# Patient Record
Sex: Female | Born: 1975
Health system: Southern US, Community
[De-identification: ages and names within clinical notes are randomized; demographics above are authoritative.]

## PROBLEM LIST (undated history)

## (undated) DIAGNOSIS — Z8041 Family history of malignant neoplasm of ovary: Secondary | ICD-10-CM

## (undated) DIAGNOSIS — F329 Major depressive disorder, single episode, unspecified: Secondary | ICD-10-CM

## (undated) DIAGNOSIS — B019 Varicella without complication: Secondary | ICD-10-CM

## (undated) DIAGNOSIS — Z800 Family history of malignant neoplasm of digestive organs: Secondary | ICD-10-CM

## (undated) DIAGNOSIS — F419 Anxiety disorder, unspecified: Secondary | ICD-10-CM

## (undated) DIAGNOSIS — Z808 Family history of malignant neoplasm of other organs or systems: Secondary | ICD-10-CM

## (undated) DIAGNOSIS — E785 Hyperlipidemia, unspecified: Secondary | ICD-10-CM

## (undated) DIAGNOSIS — F32A Depression, unspecified: Secondary | ICD-10-CM

## (undated) DIAGNOSIS — Z8042 Family history of malignant neoplasm of prostate: Secondary | ICD-10-CM

## (undated) DIAGNOSIS — N390 Urinary tract infection, site not specified: Secondary | ICD-10-CM

## (undated) HISTORY — DX: Major depressive disorder, single episode, unspecified: F32.9

## (undated) HISTORY — DX: Family history of malignant neoplasm of other organs or systems: Z80.8

## (undated) HISTORY — DX: Family history of malignant neoplasm of ovary: Z80.41

## (undated) HISTORY — DX: Family history of malignant neoplasm of prostate: Z80.42

## (undated) HISTORY — DX: Hyperlipidemia, unspecified: E78.5

## (undated) HISTORY — DX: Depression, unspecified: F32.A

## (undated) HISTORY — DX: Anxiety disorder, unspecified: F41.9

## (undated) HISTORY — DX: Varicella without complication: B01.9

## (undated) HISTORY — DX: Family history of malignant neoplasm of digestive organs: Z80.0

## (undated) HISTORY — DX: Urinary tract infection, site not specified: N39.0

---
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
## 1998-11-03 HISTORY — PX: ANKLE SURGERY: SHX546

## 2000-11-03 HISTORY — PX: BREAST BIOPSY: SHX20

## 2013-07-25 ENCOUNTER — Encounter: Payer: Self-pay | Admitting: Nurse Practitioner

## 2013-07-25 ENCOUNTER — Ambulatory Visit (INDEPENDENT_AMBULATORY_CARE_PROVIDER_SITE_OTHER): Payer: BC Managed Care – PPO | Admitting: Nurse Practitioner

## 2013-07-25 VITALS — BP 110/74 | HR 60 | Resp 16 | Ht 65.0 in | Wt 171.0 lb

## 2013-07-25 DIAGNOSIS — Z01419 Encounter for gynecological examination (general) (routine) without abnormal findings: Secondary | ICD-10-CM

## 2013-07-25 NOTE — Progress Notes (Signed)
Patient ID: Pamela Villegas, female   DOB: 1976-01-24, 37 y.o.   MRN: [id number] 37 y.o. G0P0 Single Caucasian Fe here for annual exam.  Menses now at 3-4 days, some cramps. Some bloating.  No PMS.  Same partner for 2.5 years. 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  Really likes her job.  Patient's last menstrual period was 07/02/2013.          Sexually active: yes  The current method of family planning is female/female relationship.    Exercising: yes  Home exercise routine includes running and lifting weights 2-3 times week. Smoker:  no  Health Maintenance: Pap:  06/22/12, WNL, neg HR HPV TDaP:  05/2012 Gardasil completed 2006 Labs: PCP   reports that she has never smoked. She has never used smokeless tobacco. She reports that she drinks about 3.0 ounces of alcohol per week. She reports that she does not use illicit drugs.  Past Medical History  Diagnosis Date  . Depression   . Anxiety     Past Surgical History  Procedure Laterality Date  . Ankle surgery Right 2000    Current Outpatient Prescriptions  Medication Sig Dispense Refill  . B Complex Vitamins (B COMPLEX 1 PO) Take by mouth daily.      . busPIRone (BUSPAR) 5 MG tablet Take 5 mg by mouth 2 (two) times daily.      . Flaxseed, Linseed, (FLAXSEED OIL PO) Take by mouth daily.      . Multiple Vitamin (MULTI-VITAMIN DAILY PO) Take by mouth daily.       No current facility-administered medications for this visit.    Family History  Problem Relation Age of Onset  . Cancer Mother 61    ovarian cancer  . Cancer Father 58    Lung cancer  . Osteoporosis Maternal Grandmother   . Cancer Maternal Grandfather 14    Colon Cancer  . Parkinsonism Paternal Grandmother     ROS:  Pertinent items are noted in HPI.  Otherwise, a comprehensive ROS was negative.  Exam:   BP 110/74  Pulse 60  Resp 16  Ht 5\' 5"  (1.651 m)  Wt 171 lb (77.565 kg)  BMI 28.46 kg/m2  LMP 07/02/2013 Height: 5\' 5"  (165.1 cm)  Ht  Readings from Last 3 Encounters:  07/25/13 5\' 5"  (1.651 m)    General appearance: alert, cooperative and appears stated age Head: Normocephalic, without obvious abnormality, atraumatic Neck: no adenopathy, supple, symmetrical, trachea midline and thyroid normal to inspection and palpation Lungs: clear to auscultation bilaterally Breasts: normal appearance, no masses or tenderness Heart: regular rate and rhythm Abdomen: soft, non-tender; no masses,  no organomegaly Extremities: extremities normal, atraumatic, no cyanosis or edema Skin: Skin color, texture, turgor normal. No rashes or lesions Lymph nodes: Cervical, supraclavicular, and axillary nodes normal. No abnormal inguinal nodes palpated Neurologic: Grossly normal   Pelvic: External genitalia:  no lesions              Urethra:  normal appearing urethra with no masses, tenderness or lesions              Bartholin's and Skene's: normal                 Vagina: normal appearing vagina with normal color and discharge, no lesions              Cervix: anteverted              Pap  taken: no Bimanual Exam:  Uterus:  normal size, contour, position, consistency, mobility, non-tender              Adnexa: no mass, fullness, tenderness               Rectovaginal: Confirms               Anus:  normal sphincter tone, no lesions  A:  Well Woman with normal exam  Regular menses  Female relationship - no BC needed  P:   Pap smear as per guidelines not done today  Menses record   Counseled on breast self exam, adequate intake of calcium and vitamin D, diet and exercise return annually or prn  An After Visit Summary was printed and given to the patient.

## 2013-07-25 NOTE — Patient Instructions (Signed)

## 2013-07-26 NOTE — Progress Notes (Signed)
Encounter reviewed by Dr. Brook Silva.  

## 2015-06-12 ENCOUNTER — Ambulatory Visit (INDEPENDENT_AMBULATORY_CARE_PROVIDER_SITE_OTHER): Payer: BLUE CROSS/BLUE SHIELD | Admitting: Nurse Practitioner

## 2015-06-12 ENCOUNTER — Encounter: Payer: Self-pay | Admitting: Nurse Practitioner

## 2015-06-12 VITALS — BP 122/84 | HR 56 | Ht 64.0 in | Wt 171.0 lb

## 2015-06-12 DIAGNOSIS — Z0000 Encounter for general adult medical examination without abnormal findings: Secondary | ICD-10-CM

## 2015-06-12 DIAGNOSIS — Z8041 Family history of malignant neoplasm of ovary: Secondary | ICD-10-CM | POA: Diagnosis not present

## 2015-06-12 DIAGNOSIS — Z01419 Encounter for gynecological examination (general) (routine) without abnormal findings: Secondary | ICD-10-CM | POA: Diagnosis not present

## 2015-06-12 LAB — POCT URINALYSIS DIPSTICK
Bilirubin, UA: NEGATIVE
Blood, UA: NEGATIVE
Glucose, UA: NEGATIVE
KETONES UA: NEGATIVE
LEUKOCYTES UA: NEGATIVE
NITRITE UA: NEGATIVE
PH UA: 5
Protein, UA: NEGATIVE
Urobilinogen, UA: NEGATIVE

## 2015-06-12 LAB — TSH: TSH: 1.346 u[IU]/mL (ref 0.350–4.500)

## 2015-06-12 MED ORDER — NORETHIN ACE-ETH ESTRAD-FE 1-20 MG-MCG PO TABS: 1.0000 | ORAL_TABLET | Freq: Every day | ORAL | Status: DC

## 2015-06-12 NOTE — Progress Notes (Signed)
Patient ID: Pamela Villegas, female   DOB: 30-Oct-1976, 39 y.o.   MRN: [id number] 39 y.o. G0P0 Single  Caucasian Fe here for annual exam.  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  Menses is normal lasting 3 days but very heavy X 1 day.  Super plus tampon changing every 2 hours, some back pain.  She wants to consider OCP again for cycle regulation and to decrease flow.  In the past took a pack of OCP for 1 months and had BTB all month so never took any more.Change of partner - female and female.  Patient's last menstrual period was 05/24/2015 (exact date).          Sexually active: Yes.    The current method of family planning is none. Same sex partner.   Exercising: Yes.    running and lifting weights 3-4 times per week Smoker:  no  Health Maintenance: Pap: 06/22/12, WNL, neg HR HPV TDaP: 05/2012 Gardasil completed 2006 Labs:  HB:  14.2  Urine:  Negative    reports that she has never smoked. She has never used smokeless tobacco. She reports that she drinks about 3.0 oz of alcohol per week. She reports that she does not use illicit drugs.  Past Medical History  Diagnosis Date  . Depression   . Anxiety     Past Surgical History  Procedure Laterality Date  . Ankle surgery Right 2000  . Wisdom tooth extraction  1998    Current Outpatient Prescriptions  Medication Sig Dispense Refill  . Cholecalciferol (VITAMIN D PO) Take by mouth daily.    . Flaxseed, Linseed, (FLAXSEED OIL PO) Take by mouth daily.    . Multiple Vitamin (MULTI-VITAMIN DAILY PO) Take by mouth daily.    Marland Kitchen POTASSIUM PO Take by mouth daily.    . Probiotic Product (PROBIOTIC PO) Take 1 capsule by mouth daily.    . norethindrone-ethinyl estradiol (JUNEL FE,GILDESS FE,LOESTRIN FE) 1-20 MG-MCG tablet Take 1 tablet by mouth daily. 3 Package 3   No current facility-administered medications for this visit.    Family History  Problem Relation Age of Onset  . Ovarian cancer Mother 69    ovarian cancer  . Lung cancer Father 40     Lung cancer  . Osteoporosis Maternal Grandmother   . Colon cancer Maternal Grandfather 75    Colon Cancer  . Parkinsonism Paternal Grandmother   . Melanoma Sister 36    back     ROS:  Pertinent items are noted in HPI.  Otherwise, a comprehensive ROS was negative.  Exam:   BP 122/84 mmHg  Pulse 56  Ht  (1.626 m)  Wt 171 lb (77.565 kg)  BMI 29.34 kg/m2  LMP 05/24/2015 (Exact Date) Height:  (162.6 cm) Ht Readings from Last 3 Encounters:  06/12/15  (1.626 m)  07/25/13  (1.651 m)    General appearance: alert, cooperative and appears stated age Head: Normocephalic, without obvious abnormality, atraumatic Neck: no adenopathy, supple, symmetrical, trachea midline and thyroid normal to inspection and palpation Lungs: clear to auscultation bilaterally Breasts: normal appearance, no masses or tenderness Heart: regular rate and rhythm Abdomen: soft, non-tender; no masses,  no organomegaly Extremities: extremities normal, atraumatic, no cyanosis or edema Skin: Skin color, texture, turgor normal. No rashes or lesions Lymph nodes: Cervical, supraclavicular, and axillary nodes normal. No abnormal inguinal nodes palpated Neurologic: Grossly normal   Pelvic: External genitalia:  no lesions  Urethra:  normal appearing urethra with no masses, tenderness or lesions              Bartholin's and Skene's: normal                 Vagina: normal appearing vagina with normal color and discharge, no lesions              Cervix: anteverted              Pap taken: Yes.   Bimanual Exam:  Uterus:  normal size, contour, position, consistency, mobility, non-tender              Adnexa: no mass, fullness, tenderness               Rectovaginal: Confirms               Anus:  normal sphincter tone, no lesions  Chaperone present:  yes  A:  Well Woman with normal exam  Regular menses Female / female relationship-  Using condoms  Menorrhagia X 1 day  R/O  STD's  FMH: mother OV cancer, sister with Melanoma, father with lung cancer   P:   Reviewed health and wellness pertinent to exam  Pap smear as above  Will start on Loestrin Fe 1/20 to start after next menses, review BUM, SE, RX X 1 year  Follow with labs  Counseled on breast self exam, use and side effects of OCP's, adequate intake of calcium and vitamin D, diet and exercise return annually or prn  An After Visit Summary was printed and given to the patient.

## 2015-06-12 NOTE — Patient Instructions (Signed)

## 2015-06-12 NOTE — Progress Notes (Signed)
Encounter reviewed by Dr. Brook Amundson C. Silva.  

## 2015-06-13 LAB — LIPID PANEL
CHOLESTEROL: 203 mg/dL — AB (ref 125–200)
HDL: 63 mg/dL (ref >=46)
LDL Cholesterol: 115 mg/dL (ref <130)
Total CHOL/HDL Ratio: 3.2 Ratio (ref <=5.0)
Triglycerides: 126 mg/dL (ref <150)
VLDL: 25 mg/dL (ref <30)

## 2015-06-13 LAB — VITAMIN D 25 HYDROXY (VIT D DEFICIENCY, FRACTURES): VIT D 25 HYDROXY: 55 ng/mL (ref 30–100)

## 2015-06-13 LAB — COMPREHENSIVE METABOLIC PANEL
ALBUMIN: 4.5 g/dL (ref 3.6–5.1)
ALT: 15 U/L (ref 6–29)
AST: 18 U/L (ref 10–30)
Alkaline Phosphatase: 50 U/L (ref 33–115)
BILIRUBIN TOTAL: 0.7 mg/dL (ref 0.2–1.2)
BUN: 11 mg/dL (ref 7–25)
CALCIUM: 9.8 mg/dL (ref 8.6–10.2)
CHLORIDE: 102 mmol/L (ref 98–110)
CO2: 25 mmol/L (ref 20–31)
Creat: 0.87 mg/dL (ref 0.50–1.10)
Glucose, Bld: 84 mg/dL (ref 65–99)
Potassium: 4.7 mmol/L (ref 3.5–5.3)
Sodium: 142 mmol/L (ref 135–146)
Total Protein: 7 g/dL (ref 6.1–8.1)

## 2015-06-13 LAB — STD PANEL
HIV: NONREACTIVE
Hepatitis B Surface Ag: NEGATIVE

## 2015-06-14 LAB — IPS N GONORRHOEA AND CHLAMYDIA BY PCR

## 2015-06-14 LAB — HEMOGLOBIN, FINGERSTICK: Hemoglobin, fingerstick: 14.2 g/dL (ref 12.0–16.0)

## 2015-06-14 LAB — IPS PAP TEST WITH HPV

## 2015-10-11 ENCOUNTER — Other Ambulatory Visit: Payer: Self-pay | Admitting: *Deleted

## 2015-10-11 MED ORDER — NORETHIN ACE-ETH ESTRAD-FE 1-20 MG-MCG PO TABS: 1.0000 | ORAL_TABLET | Freq: Every day | ORAL | Status: DC

## 2016-05-15 DIAGNOSIS — N390 Urinary tract infection, site not specified: Secondary | ICD-10-CM | POA: Diagnosis not present

## 2016-05-15 DIAGNOSIS — R300 Dysuria: Secondary | ICD-10-CM | POA: Diagnosis not present

## 2016-05-19 ENCOUNTER — Other Ambulatory Visit: Payer: Self-pay | Admitting: Nurse Practitioner

## 2016-05-19 NOTE — Telephone Encounter (Signed)
Medication refill request: BLISOVI FE 1/20 MG-MCG Last AEX:  06/12/15 PG Next AEX: 06/24/16 Last MMG (if hormonal medication request): n/a Refill authorized: 10/11/15 #3packs w/2 refills; today #3packs w/0 refills?

## 2016-06-18 ENCOUNTER — Ambulatory Visit: Payer: BLUE CROSS/BLUE SHIELD | Admitting: Nurse Practitioner

## 2016-06-23 DIAGNOSIS — L821 Other seborrheic keratosis: Secondary | ICD-10-CM | POA: Diagnosis not present

## 2016-06-23 DIAGNOSIS — Z1283 Encounter for screening for malignant neoplasm of skin: Secondary | ICD-10-CM | POA: Diagnosis not present

## 2016-06-24 ENCOUNTER — Encounter: Payer: Self-pay | Admitting: Nurse Practitioner

## 2016-06-24 ENCOUNTER — Ambulatory Visit (INDEPENDENT_AMBULATORY_CARE_PROVIDER_SITE_OTHER): Payer: BLUE CROSS/BLUE SHIELD | Admitting: Nurse Practitioner

## 2016-06-24 VITALS — BP 132/76 | HR 60 | Ht 64.0 in | Wt 153.0 lb

## 2016-06-24 DIAGNOSIS — Z01419 Encounter for gynecological examination (general) (routine) without abnormal findings: Secondary | ICD-10-CM

## 2016-06-24 DIAGNOSIS — Z8041 Family history of malignant neoplasm of ovary: Secondary | ICD-10-CM

## 2016-06-24 DIAGNOSIS — Z0000 Encounter for general adult medical examination without abnormal findings: Secondary | ICD-10-CM

## 2016-06-24 LAB — POCT URINALYSIS DIPSTICK
Bilirubin, UA: NEGATIVE
Glucose, UA: NEGATIVE
KETONES UA: NEGATIVE
Leukocytes, UA: NEGATIVE
Nitrite, UA: NEGATIVE
PROTEIN UA: NEGATIVE
RBC UA: NEGATIVE
Urobilinogen, UA: NEGATIVE
pH, UA: 6

## 2016-06-24 MED ORDER — NORETHIN ACE-ETH ESTRAD-FE 1-20 MG-MCG PO TABS: 1.0000 | ORAL_TABLET | Freq: Every day | ORAL | 4 refills | Status: DC

## 2016-06-24 NOTE — Progress Notes (Signed)
Patient ID: Pamela Villegas, female   DOB: 03/31/1976, 40 y.o.   MRN: [id number]  40 y.o. G0P0 Single Caucasian Fe here for annual exam.  Menses is normal -other than occurring when a new pack of pills is started.  This generic for about 6 months - will monitor bleeding and let us know as this change may be with this generic brand of OCP.  Same partner this past year - female - monogamous for a month.  She has been with another partner but had STD's done when that relationship ended.  The former female partner is not in her life at this time.  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  Patient's last menstrual period was 06/10/2016 (exact date).          Sexually active: Yes.    The current method of family planning is OCP (estrogen/progesterone).    Exercising: Yes.    run and lift weights Smoker:  no  Health Maintenance: Pap:06/12/15, Negative with neg HR HPV TDaP: 05/2012 Gardasil completed 2006 HIV: 06/12/15 Labs: will return fasting  Urine: negative   reports that she has never smoked. She has never used smokeless tobacco. She reports that she drinks about 3.0 oz of alcohol per week . She reports that she does not use drugs.  Past Medical History:  Diagnosis Date  . Anxiety   . Depression     Past Surgical History:  Procedure Laterality Date  . ANKLE SURGERY Right 2000  . WISDOM TOOTH EXTRACTION  1998    Current Outpatient Prescriptions  Medication Sig Dispense Refill  . Cholecalciferol (VITAMIN D3) 1000 units CAPS Take 1 capsule by mouth daily.    . Cranberry 500 MG CAPS Take 1 capsule by mouth daily.    . cyanocobalamin 1000 MCG tablet Take 1,000 mcg by mouth daily.    . Multiple Vitamin (MULTI-VITAMIN DAILY PO) Take by mouth daily.    . norethindrone-ethinyl estradiol (BLISOVI FE 1/20) 1-20 MG-MCG tablet Take 1 tablet by mouth daily. 84 tablet 4  . Omega-3 1000 MG CAPS Take 1 capsule by mouth daily.    . Probiotic Product (PROBIOTIC PO) Take 1 capsule by mouth daily.      No current facility-administered medications for this visit.     Family History  Problem Relation Age of Onset  . Ovarian cancer Mother [id number]    ovarian cancer  . Lung cancer Father 5362    Lung cancer  . Osteoporosis Maternal Grandmother   . Colon cancer Maternal Grandfather 6089    Colon Cancer  . Parkinsonism Paternal Grandmother   . Melanoma Sister 36    back     ROS:  Pertinent items are noted in HPI.  Otherwise, a comprehensive ROS was negative.  Exam:   BP 132/76 (BP Location: Right Arm, Patient Position: Sitting, Cuff Size: Normal)   Pulse 60   Ht 5\' 4"  (1.626 m)   Wt 153 lb (69.4 kg)   LMP 06/10/2016 (Exact Date)   BMI 26.26 kg/m  Height: 5\' 4"  (162.6 cm) Ht Readings from Last 3 Encounters:  06/24/16 5\' 4"  (1.626 m)  06/12/15 5\' 4"  (1.626 m)  07/25/13 5\' 5"  (1.651 m)    General appearance: alert, cooperative and appears stated age Head: Normocephalic, without obvious abnormality, atraumatic Neck: no adenopathy, supple, symmetrical, trachea midline and thyroid normal to inspection and palpation Lungs: clear to auscultation bilaterally Breasts: normal appearance, no masses or tenderness Heart: regular rate and rhythm Abdomen: soft, non-tender;  no masses,  no organomegaly Extremities: extremities normal, atraumatic, no cyanosis or edema Skin: Skin color, texture, turgor normal. No rashes or lesions Lymph nodes: Cervical, supraclavicular, and axillary nodes normal. No abnormal inguinal nodes palpated Neurologic: Grossly normal   Pelvic: External genitalia:  no lesions              Urethra:  normal appearing urethra with no masses, tenderness or lesions              Bartholin's and Skene's: normal                 Vagina: normal appearing vagina with normal color and discharge, no lesions              Cervix: anteverted              Pap taken: No. Bimanual Exam:  Uterus:  normal size, contour, position, consistency, mobility, non-tender              Adnexa: no  mass, fullness, tenderness               Rectovaginal: Confirms               Anus:  normal sphincter tone, no lesions  Chaperone present: yes  A:  Well Woman with normal exam             Regular menses Female / female relationship-  Using condoms              Menorrhagia X 1 day              Declines  STD's              FMH: mother OV cancer, sister with Melanoma, father with lung cancer   P:   Reviewed health and wellness pertinent to exam  Pap smear as above  Refill on OCP for a year  Will plan to do pap and fasting labs next AEX  Counseled on breast self exam, STD prevention, HIV risk factors and prevention, use and side effects of OCP's, adequate intake of calcium and vitamin D, diet and exercise return annually or prn  An After Visit Summary was printed and given to the patient.

## 2016-06-24 NOTE — Patient Instructions (Signed)

## 2016-06-24 NOTE — Progress Notes (Signed)
Reviewed personally.  M. Suzanne Hance Caspers, MD.  

## 2016-09-27 DIAGNOSIS — R300 Dysuria: Secondary | ICD-10-CM | POA: Diagnosis not present

## 2016-09-27 DIAGNOSIS — N3000 Acute cystitis without hematuria: Secondary | ICD-10-CM | POA: Diagnosis not present

## 2016-12-17 DIAGNOSIS — L570 Actinic keratosis: Secondary | ICD-10-CM | POA: Diagnosis not present

## 2016-12-17 DIAGNOSIS — L728 Other follicular cysts of the skin and subcutaneous tissue: Secondary | ICD-10-CM | POA: Diagnosis not present

## 2016-12-17 DIAGNOSIS — X32XXXA Exposure to sunlight, initial encounter: Secondary | ICD-10-CM | POA: Diagnosis not present

## 2016-12-17 DIAGNOSIS — L720 Epidermal cyst: Secondary | ICD-10-CM | POA: Diagnosis not present

## 2017-01-07 DIAGNOSIS — R809 Proteinuria, unspecified: Secondary | ICD-10-CM | POA: Diagnosis not present

## 2017-01-07 DIAGNOSIS — Z0000 Encounter for general adult medical examination without abnormal findings: Secondary | ICD-10-CM | POA: Diagnosis not present

## 2017-01-07 DIAGNOSIS — F3181 Bipolar II disorder: Secondary | ICD-10-CM | POA: Diagnosis not present

## 2017-01-29 DIAGNOSIS — F411 Generalized anxiety disorder: Secondary | ICD-10-CM | POA: Diagnosis not present

## 2017-01-29 DIAGNOSIS — F331 Major depressive disorder, recurrent, moderate: Secondary | ICD-10-CM | POA: Diagnosis not present

## 2017-03-03 DIAGNOSIS — F331 Major depressive disorder, recurrent, moderate: Secondary | ICD-10-CM | POA: Diagnosis not present

## 2017-03-03 DIAGNOSIS — F411 Generalized anxiety disorder: Secondary | ICD-10-CM | POA: Diagnosis not present

## 2017-03-11 ENCOUNTER — Other Ambulatory Visit: Payer: Self-pay | Admitting: Physician Assistant

## 2017-03-11 ENCOUNTER — Ambulatory Visit
Admission: RE | Admit: 2017-03-11 | Discharge: 2017-03-11 | Disposition: A | Discharge status: Home / Self Care | Payer: BLUE CROSS/BLUE SHIELD | Source: Ambulatory Visit | Attending: Physician Assistant | Admitting: Physician Assistant

## 2017-03-11 DIAGNOSIS — M25551 Pain in right hip: Secondary | ICD-10-CM

## 2017-03-11 DIAGNOSIS — M25552 Pain in left hip: Secondary | ICD-10-CM | POA: Diagnosis not present

## 2017-03-18 DIAGNOSIS — N390 Urinary tract infection, site not specified: Secondary | ICD-10-CM | POA: Diagnosis not present

## 2017-03-18 DIAGNOSIS — R1031 Right lower quadrant pain: Secondary | ICD-10-CM | POA: Diagnosis not present

## 2017-03-24 ENCOUNTER — Ambulatory Visit (INDEPENDENT_AMBULATORY_CARE_PROVIDER_SITE_OTHER): Payer: BLUE CROSS/BLUE SHIELD | Admitting: Nurse Practitioner

## 2017-03-24 ENCOUNTER — Encounter: Payer: Self-pay | Admitting: Nurse Practitioner

## 2017-03-24 VITALS — BP 120/80 | HR 60 | Resp 12 | Ht 64.0 in | Wt 140.4 lb

## 2017-03-24 DIAGNOSIS — N926 Irregular menstruation, unspecified: Secondary | ICD-10-CM

## 2017-03-24 LAB — POCT URINE PREGNANCY: Preg Test, Ur: NEGATIVE

## 2017-03-24 NOTE — Progress Notes (Signed)
GYNECOLOGY  VISIT   HPI: 41 y.o.   Single  Caucasian  female  G0P0 with concerns about intermenstrual bleeding.  She has been on OCP for contraception and is compliant with dosing.  At last AEX she was starting her cycle when she starts a new pack of pills.  So that again is occurring but sometimes even later in the first week and then again may bleed just before the end of pack. So now seems like every 3 weeks.  The type of bleeding is usually brown with occasional red like a normal cycle.  She is more SA than before with this new partner of almost a year.   There is no post coital spotting.  STD's done last August were all negative.  She is having more frequent UTI and is currently on Macrobid almost a week.  While at PCP during exam there was a most painful area to palpation RLQ and she was told that she may have an OV cyst.  She is concerned about that.  Also going to see Urologist on 04/10/17 for persistent UTI despite all the measures to prevent an infection. She denies pain with SA.  Denies N/V/GERD constipation or diarrhea.  The CBC last week was normal.   GYNECOLOGIC HISTORY: Patient's last menstrual period was 03/16/2017. Contraception:OCP's          OB History    Gravida Para Term Preterm AB Living   0 0           SAB TAB Ectopic Multiple Live Births                     There are no active problems to display for this patient.   Past Medical History:  Diagnosis Date  . Anxiety   . Depression     Past Surgical History:  Procedure Laterality Date  . ANKLE SURGERY Right 2000  . WISDOM TOOTH EXTRACTION  1998    Current Outpatient Prescriptions  Medication Sig Dispense Refill  . Cholecalciferol (VITAMIN D3) 1000 units CAPS Take 1 capsule by mouth daily.    . Multiple Vitamin (MULTI-VITAMIN DAILY PO) Take by mouth daily.    . nitrofurantoin, macrocrystal-monohydrate, (MACROBID) 100 MG capsule     . norethindrone-ethinyl estradiol (BLISOVI FE 1/20) 1-20 MG-MCG tablet Take 1  tablet by mouth daily. 84 tablet 4  . Omega-3 1000 MG CAPS Take 1 capsule by mouth daily.     No current facility-administered medications for this visit.      ALLERGIES: Patient has no known allergies.  Family History  Problem Relation Age of Onset  . Ovarian cancer Mother 27       ovarian cancer  . Lung cancer Father 69       Lung cancer  . Osteoporosis Maternal Grandmother   . Colon cancer Maternal Grandfather 39       Colon Cancer  . Parkinsonism Paternal Grandmother   . Melanoma Sister 66       back     Social History   Social History  . Marital status: Single    Spouse name: N/A  . Number of children: 0  . Years of education: N/A   Occupational History  . Not on file.   Social History Main Topics  . Smoking status: Never Smoker  . Smokeless tobacco: Never Used  . Alcohol use 3.0 oz/week    6 Standard drinks or equivalent per week  . Drug use: No  . Sexual activity:  Yes    Partners: Female   Other Topics Concern  . Not on file   Social History Narrative  . No narrative on file    ROS  PHYSICAL EXAMINATION:    BP 120/80 (BP Location: Right Arm, Patient Position: Sitting, Cuff Size: Normal)   Pulse 60   Resp 12   Ht 5\' 4"  (1.626 m)   Wt 140 lb 6.4 oz (63.7 kg)   LMP 03/16/2017   BMI 24.10 kg/m      UPT: negative  General appearance: alert, cooperative and appears stated age  Pelvic: External genitalia:  no lesions              Urethra:  normal appearing urethra with no masses, tenderness or lesions              Bartholin's and Skene's: normal                 Vagina: normal appearing vagina with normal color and only light brown spotting, no lesions              Cervix: anteverted and no lesions              Bimanual Exam:  Uterus:  normal size, contour, position, consistency, mobility, non-tender              Adnexa: no mass, fullness, tenderness, unable to elicit same pain.              Rectovaginal: Yes.  .  Confirms.              Anus:   normal sphincter tone, no lesions  Chaperone was present for exam.  ASSESSMENT   AUB on OCP - may need to change kind of pill  RLQ pain on recent exam with PCP - R/O OV cyst  Current UTI on treatment  ? Other origin such as Endometriosis with reflux bleeding, endo polyps, etc.    PLAN    Will get a PUS and follow - scheduled for 04/02/17 with Dr. Edward JollySilva  Will then decide on next treatment  An After Visit Summary was printed and given to the patient.

## 2017-03-24 NOTE — Patient Instructions (Signed)
IC  Interstitial cystitis Function of the urethra - prolapse Pelvic muscle support

## 2017-03-27 NOTE — Progress Notes (Signed)
Encounter reviewed by Dr. Brook Amundson C. Silva.  

## 2017-04-02 ENCOUNTER — Ambulatory Visit (INDEPENDENT_AMBULATORY_CARE_PROVIDER_SITE_OTHER): Payer: BLUE CROSS/BLUE SHIELD

## 2017-04-02 ENCOUNTER — Encounter: Payer: Self-pay | Admitting: Obstetrics and Gynecology

## 2017-04-02 ENCOUNTER — Other Ambulatory Visit: Payer: Self-pay

## 2017-04-02 ENCOUNTER — Ambulatory Visit: Payer: BLUE CROSS/BLUE SHIELD | Admitting: Obstetrics and Gynecology

## 2017-04-02 VITALS — BP 100/70 | HR 46 | Ht 64.0 in | Wt 140.0 lb

## 2017-04-02 DIAGNOSIS — N83201 Unspecified ovarian cyst, right side: Secondary | ICD-10-CM | POA: Diagnosis not present

## 2017-04-02 DIAGNOSIS — R1031 Right lower quadrant pain: Secondary | ICD-10-CM | POA: Diagnosis not present

## 2017-04-02 DIAGNOSIS — N926 Irregular menstruation, unspecified: Secondary | ICD-10-CM | POA: Diagnosis not present

## 2017-04-02 MED ORDER — NORETHINDRONE ACET-ETHINYL EST 1.5-30 MG-MCG PO TABS: 1.0000 | ORAL_TABLET | Freq: Every day | ORAL | 1 refills | Status: DC

## 2017-04-02 NOTE — Patient Instructions (Signed)
Ovarian Cyst An ovarian cyst is a fluid-filled sac that forms on an ovary. The ovaries are small organs that produce eggs in women. Various types of cysts can form on the ovaries. Some may cause symptoms and require treatment. Most ovarian cysts go away on their own, are not cancerous (are benign), and do not cause problems. Common types of ovarian cysts include:  Functional (follicle) cysts. ? Occur during the menstrual cycle, and usually go away with the next menstrual cycle if you do not get pregnant. ? Usually cause no symptoms.  Endometriomas. ? Are cysts that form from the tissue that lines the uterus (endometrium). ? Are sometimes called "chocolate cysts" because they become filled with blood that turns brown. ? Can cause pain in the lower abdomen during intercourse and during your period.  Cystadenoma cysts. ? Develop from cells on the outside surface of the ovary. ? Can get very large and cause lower abdomen pain and pain with intercourse. ? Can cause severe pain if they twist or break open (rupture).  Dermoid cysts. ? Are sometimes found in both ovaries. ? May contain different kinds of body tissue, such as skin, teeth, hair, or cartilage. ? Usually do not cause symptoms unless they get very big.  Theca lutein cysts. ? Occur when too much of a certain hormone (human chorionic gonadotropin) is produced and overstimulates the ovaries to produce an egg. ? Are most common after having procedures used to assist with the conception of a baby (in vitro fertilization).  What are the causes? Ovarian cysts may be caused by:  Ovarian hyperstimulation syndrome. This is a condition that can develop from taking fertility medicines. It causes multiple large ovarian cysts to form.  Polycystic ovarian syndrome (PCOS). This is a common hormonal disorder that can cause ovarian cysts, as well as problems with your period or fertility.  What increases the risk? The following factors may make  you more likely to develop ovarian cysts:  Being overweight or obese.  Taking fertility medicines.  Taking certain forms of hormonal birth control.  Smoking.  What are the signs or symptoms? Many ovarian cysts do not cause symptoms. If symptoms are present, they may include:  Pelvic pain or pressure.  Pain in the lower abdomen.  Pain during sex.  Abdominal swelling.  Abnormal menstrual periods.  Increasing pain with menstrual periods.  How is this diagnosed? These cysts are commonly found during a routine pelvic exam. You may have tests to find out more about the cyst, such as:  Ultrasound.  X-ray of the pelvis.  CT scan.  MRI.  Blood tests.  How is this treated? Many ovarian cysts go away on their own without treatment. Your health care provider may want to check your cyst regularly for 2-3 months to see if it changes. If you are in menopause, it is especially important to have your cyst monitored closely because menopausal women have a higher rate of ovarian cancer. When treatment is needed, it may include:  Medicines to help relieve pain.  A procedure to drain the cyst (aspiration).  Surgery to remove the whole cyst.  Hormone treatment or birth control pills. These methods are sometimes used to help dissolve a cyst.  Follow these instructions at home:  Take over-the-counter and prescription medicines only as told by your health care provider.  Do not drive or use heavy machinery while taking prescription pain medicine.  Get regular pelvic exams and Pap tests as often as told by your health care   provider.  Return to your normal activities as told by your health care provider. Ask your health care provider what activities are safe for you.  Do not use any products that contain nicotine or tobacco, such as cigarettes and e-cigarettes. If you need help quitting, ask your health care provider.  Keep all follow-up visits as told by your health care provider.  This is important. Contact a health care provider if:  Your periods are late, irregular, or painful, or they stop.  You have pelvic pain that does not go away.  You have pressure on your bladder or trouble emptying your bladder completely.  You have pain during sex.  You have any of the following in your abdomen: ? A feeling of fullness. ? Pressure. ? Discomfort. ? Pain that does not go away. ? Swelling.  You feel generally ill.  You become constipated.  You lose your appetite.  You develop severe acne.  You start to have more body hair and facial hair.  You are gaining weight or losing weight without changing your exercise and eating habits.  You think you may be pregnant. Get help right away if:  You have abdominal pain that is severe or gets worse.  You cannot eat or drink without vomiting.  You suddenly develop a fever.  Your menstrual period is much heavier than usual. This information is not intended to replace advice given to you by your health care provider. Make sure you discuss any questions you have with your health care provider. Document Released: 10/20/2005 Document Revised: 05/09/2016 Document Reviewed: 03/23/2016 [employer] Interactive Patient Education  2018 [employer]  

## 2017-04-02 NOTE — Progress Notes (Signed)
Encounter reviewed by Dr. Brook Amundson C. Silva.  

## 2017-04-02 NOTE — Progress Notes (Signed)
Patient ID: Pamela Villegas, female   DOB: 10/12/1976, 41 y.o.   MRN: [id number] GYNECOLOGY  VISIT   HPI: 41 y.o.   Single  Caucasian  female   G0P0 with Patient's last menstrual period was 04/01/2017 (exact date).   here for pelvic ultrasound for abnormal uterine bleeding and RLQ pain.    Intermenstrual bleeding on OCPs for one year. On combined oral contraceptive.  No menses for 6 - 8 months last year.  This year having every 2 - 3 weeks and bleeding for 3 - 4 days.  Old blood.  Menses comes one week before she is expecting this to occur.   Had significant back one month ago. Lower back is affected and feels it on both sides, more usually on the right side.  RLQ pain with abdominal exam at urgent care when seen for UTI. Will see urology on 04/10/17.   Had imaging of right hip due to chronic pain for one year.  No fx or dislocation.   Mother had ovarian cancer dx at 6844.   Sexually active and needs pregnancy prevention.  Neg UPT 03/24/17.  States she is a runner and usually has a low heart beat.  Denies palpitations.  Feels well.  GYNECOLOGIC HISTORY: Patient's last menstrual period was 04/01/2017 (exact date). Contraception: OCPs--Junel Fe 1/20 Menopausal hormone therapy:  n/a Last mammogram:  n/a Last pap smear:  06-12-15 Neg:Neg HR HPV                              06-22-12 Neg:Neg HR HPV        OB History    Gravida Para Term Preterm AB Living   0 0           SAB TAB Ectopic Multiple Live Births                     There are no active problems to display for this patient.   Past Medical History:  Diagnosis Date  . Anxiety   . Depression     Past Surgical History:  Procedure Laterality Date  . ANKLE SURGERY Right 2000  . WISDOM TOOTH EXTRACTION  1998    Current Outpatient Prescriptions  Medication Sig Dispense Refill  . Cholecalciferol (VITAMIN D3) 1000 units CAPS Take 1 capsule by mouth daily.    . Multiple Vitamin (MULTI-VITAMIN DAILY PO) Take by mouth  daily.    . norethindrone-ethinyl estradiol (BLISOVI FE 1/20) 1-20 MG-MCG tablet Take 1 tablet by mouth daily. 84 tablet 4  . Omega-3 1000 MG CAPS Take 1 capsule by mouth daily.     No current facility-administered medications for this visit.      ALLERGIES: Patient has no known allergies.  Family History  Problem Relation Age of Onset  . Ovarian cancer Mother 3544       ovarian cancer  . Lung cancer Father 2762       Lung cancer  . Osteoporosis Maternal Grandmother   . Colon cancer Maternal Grandfather [phone]       Colon Cancer  . Parkinsonism Paternal Grandmother   . Melanoma Sister 536       back     Social History   Social History  . Marital status: Single    Spouse name: N/A  . Number of children: 0  . Years of education: N/A   Occupational History  . Not on file.   Social History Main  Topics  . Smoking status: Never Smoker  . Smokeless tobacco: Never Used  . Alcohol use 3.0 oz/week    6 Standard drinks or equivalent per week  . Drug use: No  . Sexual activity: Yes    Partners: Female   Other Topics Concern  . Not on file   Social History Narrative  . No narrative on file    ROS:  Pertinent items are noted in HPI.  PHYSICAL EXAMINATION:    BP 100/70   Pulse (!) 46 Comment: irregular  Ht 5\' 4"  (1.626 m)   Wt 140 lb (63.5 kg)   LMP 04/01/2017 (Exact Date) Comment: brown spotting only  BMI 24.03 kg/m     General appearance: alert, cooperative and appears stated age   Heart: regular rate and rhythm.  Rate at 60.   Pelvic ultrasound Uterus without masses. EMS 3.42 mm.  Right ovary with multiple follicles.  Dominant follicle 16 mm.  Left ovary normal.  No free fluid.   Chaperone was present for exam.  ASSESSMENT  Right ovarian cystic formation. Follicles versus cystadenoma? FH ovarian cancer. Right hip/back pain.  Irregular menses on low dose OCPs.   PLAN  Discussion of ovarian cysts.  Will check CA125.  Repeat pelvic ultrasound in 6 weeks.   Change to LoEstrin 1.5/30.  Rx for 1 pack and 1 refill.  No EMB needed at this time.    An After Visit Summary was printed and given to the patient. 25 ______ minutes face to face time of which over 50% was spent in counseling.

## 2017-04-03 LAB — CA 125: CA 125: 10 U/mL (ref <35)

## 2017-04-10 DIAGNOSIS — N390 Urinary tract infection, site not specified: Secondary | ICD-10-CM | POA: Diagnosis not present

## 2017-04-10 DIAGNOSIS — M25551 Pain in right hip: Secondary | ICD-10-CM | POA: Insufficient documentation

## 2017-04-10 DIAGNOSIS — G8929 Other chronic pain: Secondary | ICD-10-CM | POA: Insufficient documentation

## 2017-04-10 DIAGNOSIS — R102 Pelvic and perineal pain: Secondary | ICD-10-CM

## 2017-04-10 DIAGNOSIS — N83201 Unspecified ovarian cyst, right side: Secondary | ICD-10-CM | POA: Diagnosis not present

## 2017-04-23 DIAGNOSIS — N390 Urinary tract infection, site not specified: Secondary | ICD-10-CM | POA: Diagnosis not present

## 2017-04-24 DIAGNOSIS — N390 Urinary tract infection, site not specified: Secondary | ICD-10-CM | POA: Diagnosis not present

## 2017-05-14 ENCOUNTER — Telehealth: Payer: Self-pay | Admitting: Obstetrics and Gynecology

## 2017-05-14 NOTE — Telephone Encounter (Signed)
Patient called to schedule her ultrasound. She has not gotten her insurance benefits yet.   Routing to [employer]/Becky

## 2017-05-18 ENCOUNTER — Telehealth: Payer: Self-pay | Admitting: Obstetrics and Gynecology

## 2017-05-18 NOTE — Telephone Encounter (Signed)
Left message on voicemail to call and reschedule cancelled appointment. °

## 2017-05-21 NOTE — Telephone Encounter (Signed)
Call patient to review benefits for a follow up ultrasound. Left voicemail requesting a return call.

## 2017-05-22 ENCOUNTER — Other Ambulatory Visit: Payer: Self-pay | Admitting: Obstetrics and Gynecology

## 2017-05-22 NOTE — Telephone Encounter (Signed)
Please contact patient.  She needs to schedule her pelvic ultrasound for recheck of the ovary.  She also needs an annual exam.  I will refill one pack of pills.

## 2017-05-22 NOTE — Telephone Encounter (Signed)
Medication refill request: Microgestin 1.5/30 tabs Last AEX:  06/24/16 PG  Next AEX: None scheduled  Last MMG (if hormonal medication request): n/a Refill authorized: 04/02/17 1 package. 1 RF. Please advise. Thank you.

## 2017-05-25 NOTE — Telephone Encounter (Signed)
Left message per DPR to have patient call and schedule AEX and pelvic ultrasound.   Routing to Triage Nurse for scheduling.

## 2017-05-26 ENCOUNTER — Encounter: Payer: Self-pay | Admitting: Obstetrics and Gynecology

## 2017-05-26 ENCOUNTER — Ambulatory Visit (INDEPENDENT_AMBULATORY_CARE_PROVIDER_SITE_OTHER): Payer: BLUE CROSS/BLUE SHIELD

## 2017-05-26 ENCOUNTER — Other Ambulatory Visit: Payer: Self-pay | Admitting: Obstetrics and Gynecology

## 2017-05-26 ENCOUNTER — Ambulatory Visit (INDEPENDENT_AMBULATORY_CARE_PROVIDER_SITE_OTHER): Payer: BLUE CROSS/BLUE SHIELD | Admitting: Obstetrics and Gynecology

## 2017-05-26 ENCOUNTER — Other Ambulatory Visit: Payer: Self-pay

## 2017-05-26 VITALS — BP 102/60 | HR 58 | Resp 14 | Wt 145.0 lb

## 2017-05-26 DIAGNOSIS — N83201 Unspecified ovarian cyst, right side: Secondary | ICD-10-CM | POA: Diagnosis not present

## 2017-05-26 DIAGNOSIS — Z800 Family history of malignant neoplasm of digestive organs: Secondary | ICD-10-CM | POA: Diagnosis not present

## 2017-05-26 DIAGNOSIS — Z3041 Encounter for surveillance of contraceptive pills: Secondary | ICD-10-CM

## 2017-05-26 DIAGNOSIS — Z8041 Family history of malignant neoplasm of ovary: Secondary | ICD-10-CM

## 2017-05-26 DIAGNOSIS — N926 Irregular menstruation, unspecified: Secondary | ICD-10-CM | POA: Diagnosis not present

## 2017-05-26 MED ORDER — NORETHINDRONE ACET-ETHINYL EST 1.5-30 MG-MCG PO TABS: 1.0000 | ORAL_TABLET | Freq: Every day | ORAL | 0 refills | Status: DC

## 2017-05-26 NOTE — Progress Notes (Signed)
GYNECOLOGY  VISIT   HPI: 41 y.o.   Single  Caucasian  female   G0P0 with Patient's last menstrual period was 05/24/2017.   here for follow up on right ovarian cyst. She had multiple ovarian cysts on the right.  Mom had ovarian cancer, diagnosed at 6143-44, died at 3248 (in 362000) MGF with colon cancer ? In his 8170's, died in his late 7580's of esophageal cancer.  Mom had 5 siblings, MU just diagnosed with stage 3 colon cancer, 65. She is in a relationship, together for 2 years. She doesn't want to have kids ever (has talked to her partner about vasectomy).  She was switched to loestrin 1.5/30 at her last visit for BTB on the pill. No longer having BTB, now having very light cycles when she is supposed to.   GYNECOLOGIC HISTORY: Patient's last menstrual period was 05/24/2017. Contraception:OCP Menopausal hormone therapy: none         OB History    Gravida Para Term Preterm AB Living   0 0           SAB TAB Ectopic Multiple Live Births                     There are no active problems to display for this patient.   Past Medical History:  Diagnosis Date  . Anxiety   . Depression     Past Surgical History:  Procedure Laterality Date  . ANKLE SURGERY Right 2000  . WISDOM TOOTH EXTRACTION  1998    Current Outpatient Prescriptions  Medication Sig Dispense Refill  . b complex vitamins capsule Take 1 capsule by mouth daily.    . Cholecalciferol (VITAMIN D3) 1000 units CAPS Take 1 capsule by mouth daily.    Marland Kitchen. MICROGESTIN 1.5-30 MG-MCG tablet TAKE 1 TABLET BY MOUTH ONCE DAILY 21 tablet 0  . Multiple Vitamin (MULTI-VITAMIN DAILY PO) Take by mouth daily.    . Omega-3 1000 MG CAPS Take 1 capsule by mouth daily.     No current facility-administered medications for this visit.      ALLERGIES: Patient has no known allergies.  Family History  Problem Relation Age of Onset  . Ovarian cancer Mother 6644       ovarian cancer  . Lung cancer Father 4962       Lung cancer  . Osteoporosis  Maternal Grandmother   . Colon cancer Maternal Grandfather [phone]       Colon Cancer  . Parkinsonism Paternal Grandmother   . Melanoma Sister [phone]       back     Social History   Social History  . Marital status: Single    Spouse name: N/A  . Number of children: 0  . Years of education: N/A   Occupational History  . Not on file.   Social History Main Topics  . Smoking status: Never Smoker  . Smokeless tobacco: Never Used  . Alcohol use 3.0 oz/week    6 Standard drinks or equivalent per week  . Drug use: No  . Sexual activity: Yes    Partners: Female   Other Topics Concern  . Not on file   Social History Narrative  . No narrative on file    Review of Systems  Constitutional: Negative.   HENT: Negative.   Eyes: Negative.   Respiratory: Negative.   Cardiovascular: Negative.   Gastrointestinal: Negative.   Genitourinary: Negative.   Musculoskeletal: Negative.   Skin: Negative.  Neurological: Negative.   Endo/Heme/Allergies: Negative.   Psychiatric/Behavioral: Negative.     PHYSICAL EXAMINATION:    BP 102/60 (BP Location: Right Arm, Patient Position: Sitting, Cuff Size: Normal)   Pulse (!) 58   Resp 14   Wt 145 lb (65.8 kg)   LMP 05/24/2017   BMI 24.89 kg/m     General appearance: alert, cooperative and appears stated age  Chaperone was present for exam.  Ultrasound images from this visit and the last visit were reviewed with the patient  ASSESSMENT Right ovarian cysts, resolved Breakthrough bleeding resolved FH of ovarian cancer in her mother in her early 85's and colon cancer in her maternal uncle and maternal grandfather    PLAN Continue OCP's Will refer for genetics consult F/U for annual exam in 9/18   An After Visit Summary was printed and given to the patient.  20 minutes face to face time of which over 50% was spent in counseling.

## 2017-05-28 NOTE — Telephone Encounter (Signed)
Ultrasound appointment completed on 05/26/17. Ok to close encounter °

## 2017-06-04 ENCOUNTER — Other Ambulatory Visit: Payer: BLUE CROSS/BLUE SHIELD

## 2017-06-04 ENCOUNTER — Other Ambulatory Visit: Payer: BLUE CROSS/BLUE SHIELD | Admitting: Obstetrics and Gynecology

## 2017-07-03 ENCOUNTER — Ambulatory Visit: Payer: BLUE CROSS/BLUE SHIELD | Admitting: Nurse Practitioner

## 2017-07-16 ENCOUNTER — Ambulatory Visit (INDEPENDENT_AMBULATORY_CARE_PROVIDER_SITE_OTHER): Payer: BLUE CROSS/BLUE SHIELD | Admitting: Obstetrics and Gynecology

## 2017-07-16 ENCOUNTER — Encounter: Payer: Self-pay | Admitting: Obstetrics and Gynecology

## 2017-07-16 VITALS — BP 118/70 | HR 60 | Resp 14 | Ht 64.0 in | Wt 137.0 lb

## 2017-07-16 DIAGNOSIS — E559 Vitamin D deficiency, unspecified: Secondary | ICD-10-CM

## 2017-07-16 DIAGNOSIS — Z01419 Encounter for gynecological examination (general) (routine) without abnormal findings: Secondary | ICD-10-CM

## 2017-07-16 DIAGNOSIS — Z0000 Encounter for general adult medical examination without abnormal findings: Secondary | ICD-10-CM

## 2017-07-16 DIAGNOSIS — Z3041 Encounter for surveillance of contraceptive pills: Secondary | ICD-10-CM | POA: Diagnosis not present

## 2017-07-16 DIAGNOSIS — Z800 Family history of malignant neoplasm of digestive organs: Secondary | ICD-10-CM

## 2017-07-16 DIAGNOSIS — Z8041 Family history of malignant neoplasm of ovary: Secondary | ICD-10-CM

## 2017-07-16 MED ORDER — NORETHINDRONE ACET-ETHINYL EST 1.5-30 MG-MCG PO TABS: 1.0000 | ORAL_TABLET | Freq: Every day | ORAL | 3 refills | Status: DC

## 2017-07-16 NOTE — Patient Instructions (Signed)
EXERCISE AND DIET:  We recommended that you start or continue a regular exercise program for good health. Regular exercise means any activity that makes your heart beat faster and makes you sweat.  We recommend exercising at least 30 minutes per day at least 3 days a week, preferably 4 or 5.  We also recommend a diet low in fat and sugar.  Inactivity, poor dietary choices and obesity can cause diabetes, heart attack, stroke, and kidney damage, among others.    ALCOHOL AND SMOKING:  Women should limit their alcohol intake to no more than 7 drinks/beers/glasses of wine (combined, not each!) per week. Moderation of alcohol intake to this level decreases your risk of breast cancer and liver damage. And of course, no recreational drugs are part of a healthy lifestyle.  And absolutely no smoking or even second hand smoke. Most people know smoking can cause heart and lung diseases, but did you know it also contributes to weakening of your bones? Aging of your skin?  Yellowing of your teeth and nails?  CALCIUM AND VITAMIN D:  Adequate intake of calcium and Vitamin D are recommended.  The recommendations for exact amounts of these supplements seem to change often, but generally speaking 600 mg of calcium (either carbonate or citrate) and 800 units of Vitamin D per day seems prudent. Certain women may benefit from higher intake of Vitamin D.  If you are among these women, your doctor will have told you during your visit.    PAP SMEARS:  Pap smears, to check for cervical cancer or precancers,  have traditionally been done yearly, although recent scientific advances have shown that most women can have pap smears less often.  However, every woman still should have a physical exam from her gynecologist every year. It will include a breast check, inspection of the vulva and vagina to check for abnormal growths or skin changes, a visual exam of the cervix, and then an exam to evaluate the size and shape of the uterus and  ovaries.  And after 39 years of age, a rectal exam is indicated to check for rectal cancers. We will also provide age appropriate advice regarding health maintenance, like when you should have certain vaccines, screening for sexually transmitted diseases, bone density testing, colonoscopy, mammograms, etc.   MAMMOGRAMS:  All women over 39 years old should have a yearly mammogram. Many facilities now offer a "3D" mammogram, which may cost around $50 extra out of pocket. If possible,  we recommend you accept the option to have the 3D mammogram performed.  It both reduces the number of women who will be called back for extra views which then turn out to be normal, and it is better than the routine mammogram at detecting truly abnormal areas.    COLONOSCOPY:  Colonoscopy to screen for colon cancer is recommended for all women at age 39.  We know, you hate the idea of the prep.  We agree, BUT, having colon cancer and not knowing it is worse!!  Colon cancer so often starts as a polyp that can be seen and removed at colonscopy, which can quite literally save your life!  And if your first colonoscopy is normal and you have no family history of colon cancer, most women don't have to have it again for 10 years.  Once every ten years, you can do something that may end up saving your life, right?  We will be happy to help you get it scheduled when you are ready.    Be sure to check your insurance coverage so you understand how much it will cost.  It may be covered as a preventative service at no cost, but you should check your particular policy.      Breast Self-Awareness Breast self-awareness means being familiar with how your breasts look and feel. It involves checking your breasts regularly and reporting any changes to your health care provider. Practicing breast self-awareness is important. A change in your breasts can be a sign of a serious medical problem. Being familiar with how your breasts look and feel allows  you to find any problems early, when treatment is more likely to be successful. All women should practice breast self-awareness, including women who have had breast implants. How to do a breast self-exam One way to learn what is normal for your breasts and whether your breasts are changing is to do a breast self-exam. To do a breast self-exam: Look for Changes  1. Remove all the clothing above your waist. 2. Stand in front of a mirror in a room with good lighting. 3. Put your hands on your hips. 4. Push your hands firmly downward. 5. Compare your breasts in the mirror. Look for differences between them (asymmetry), such as: ? Differences in shape. ? Differences in size. ? Puckers, dips, and bumps in one breast and not the other. 6. Look at each breast for changes in your skin, such as: ? Redness. ? Scaly areas. 7. Look for changes in your nipples, such as: ? Discharge. ? Bleeding. ? Dimpling. ? Redness. ? A change in position. Feel for Changes  Carefully feel your breasts for lumps and changes. It is best to do this while lying on your back on the floor and again while sitting or standing in the shower or tub with soapy water on your skin. Feel each breast in the following way:  Place the arm on the side of the breast you are examining above your head.  Feel your breast with the other hand.  Start in the nipple area and make  inch (2 cm) overlapping circles to feel your breast. Use the pads of your three middle fingers to do this. Apply light pressure, then medium pressure, then firm pressure. The light pressure will allow you to feel the tissue closest to the skin. The medium pressure will allow you to feel the tissue that is a little deeper. The firm pressure will allow you to feel the tissue close to the ribs.  Continue the overlapping circles, moving downward over the breast until you feel your ribs below your breast.  Move one finger-width toward the center of the body.  Continue to use the  inch (2 cm) overlapping circles to feel your breast as you move slowly up toward your collarbone.  Continue the up and down exam using all three pressures until you reach your armpit.  Write Down What You Find  Write down what is normal for each breast and any changes that you find. Keep a written record with breast changes or normal findings for each breast. By writing this information down, you do not need to depend only on memory for size, tenderness, or location. Write down where you are in your menstrual cycle, if you are still menstruating. If you are having trouble noticing differences in your breasts, do not get discouraged. With time you will become more familiar with the variations in your breasts and more comfortable with the exam. How often should I examine my breasts? Examine   your breasts every month. If you are breastfeeding, the best time to examine your breasts is after a feeding or after using a breast pump. If you menstruate, the best time to examine your breasts is 5-7 days after your period is over. During your period, your breasts are lumpier, and it may be more difficult to notice changes. When should I see my health care provider? See your health care provider if you notice:  A change in shape or size of your breasts or nipples.  A change in the skin of your breast or nipples, such as a reddened or scaly area.  Unusual discharge from your nipples.  A lump or thick area that was not there before.  Pain in your breasts.  Anything that concerns you.  This information is not intended to replace advice given to you by your health care provider. Make sure you discuss any questions you have with your health care provider. Document Released: 10/20/2005 Document Revised: 03/27/2016 Document Reviewed: 09/09/2015 Elsevier Interactive Patient Education  2018 [employer]vier Inc.  

## 2017-07-16 NOTE — Progress Notes (Signed)
41 y.o. G0P0 DivorcedCaucasianF here for annual exam.  She isn't having regular cycle on the pill, she only spots lightly. Still getting some back cramps, random. She has been on this pill for 3 months. Prior pill with lots of BTB. No longer having pain in the "right ovary". Sexually active, no pain. Same partner off and on x 2 years. No concerns about STD's.  Period Duration (Days): 2 days  Period Pattern: (!) Irregular Menstrual Flow: Light Menstrual Control: Panty liner Dysmenorrhea: None  Patient's last menstrual period was 07/06/2017.          Sexually active: Yes.    The current method of family planning is OCP (estrogen/progesterone).    Exercising: Yes.    run/ lift weights  Smoker:  no  Health Maintenance: Pap:  06-12-15 WNL NEG HR HPV  History of abnormal Pap:  no MMG:  Never Colonoscopy:  Never BMD:   Never TDaP:  unsure Gardasil: N/A   reports that she has never smoked. She has never used smokeless tobacco. She reports that she drinks alcohol. She reports that she does not use drugs. She drives a truck locally, rotating shifts.   Past Medical History:  Diagnosis Date  . Anxiety   . Depression     Past Surgical History:  Procedure Laterality Date  . ANKLE SURGERY Right 2000  . WISDOM TOOTH EXTRACTION  1998    Current Outpatient Prescriptions  Medication Sig Dispense Refill  . b complex vitamins capsule Take 1 capsule by mouth daily.    . Coenzyme Q10-Fish Oil-Vit E (CO-Q 10 OMEGA-3 FISH OIL PO) Take by mouth.    . Multiple Vitamin (MULTI-VITAMIN DAILY PO) Take by mouth daily.    . Norethindrone Acetate-Ethinyl Estradiol (MICROGESTIN) 1.5-30 MG-MCG tablet Take 1 tablet by mouth daily. 3 Package 0  . Omega-3 1000 MG CAPS Take 1 capsule by mouth daily.     No current facility-administered medications for this visit.     Family History  Problem Relation Age of Onset  . Ovarian cancer Mother 28       ovarian cancer  . Lung cancer Father 44       Lung cancer  .  Osteoporosis Maternal Grandmother   . Colon cancer Maternal Grandfather 60       Colon Cancer  . Parkinsonism Paternal Grandmother   . Melanoma Sister 10       back   Mom had ovarian cancer, diagnosed at 70-44, died at 20 (in 31) MGF with colon cancer ? In his 80's, died in his late 40's of esophageal cancer.  Mom had 5 siblings, MU just diagnosed with stage 3 colon cancer, 65. She thinks her sister had genetic testing and was + for something?  Review of Systems  Constitutional: Negative.   HENT: Negative.   Eyes: Negative.   Respiratory: Negative.   Cardiovascular: Negative.   Gastrointestinal: Negative.   Endocrine: Negative.   Genitourinary: Positive for menstrual problem.       Irregular menstrual bleeding  Loss of sexual interest   Musculoskeletal: Negative.   Skin: Negative.   Allergic/Immunologic: Negative.   Neurological: Negative.   Psychiatric/Behavioral: Negative.     Exam:   BP 118/70 (BP Location: Right Arm, Patient Position: Sitting, Cuff Size: Normal)   Pulse 60   Resp 14   Ht  (1.626 m)   Wt 137 lb (62.1 kg)   LMP 07/06/2017   BMI 23.52 kg/m   Weight change: @ Height:  Height: 5\' 4"  (162.6 cm)  Ht Readings from Last 3 Encounters:  07/16/17 5\' 4"  (1.626 m)  04/02/17 5\' 4"  (1.626 m)  03/24/17 5\' 4"  (1.626 m)    General appearance: alert, cooperative and appears stated age Head: Normocephalic, without obvious abnormality, atraumatic Neck: no adenopathy, supple, symmetrical, trachea midline and thyroid normal to inspection and palpation Lungs: clear to auscultation bilaterally Cardiovascular: regular rate and rhythm Breasts: normal appearance, no masses or tenderness Abdomen: soft, non-tender; bowel sounds normal; no masses,  no organomegaly Extremities: extremities normal, atraumatic, no cyanosis or edema Skin: Skin color, texture, turgor normal. No rashes or lesions Lymph nodes: Cervical, supraclavicular, and axillary nodes  normal. No abnormal inguinal nodes palpated Neurologic: Grossly normal   Pelvic: External genitalia:  no lesions              Urethra:  normal appearing urethra with no masses, tenderness or lesions              Bartholins and Skenes: normal                 Vagina: normal appearing vagina with normal color and discharge, no lesions              Cervix: no lesions               Bimanual Exam:  Uterus:  normal size, contour, position, consistency, mobility, non-tender              Adnexa: no mass, fullness, tenderness               Rectovaginal: Confirms               Anus:  normal sphincter tone, no lesions  Chaperone was present for exam.  A:  Well Woman with normal exam  Family history of ovarian and colon cancer, thinks sister had + genetic testing  H/O vit d def    P:   Pap next year  Screening labs  Discussed breast self exam  Discussed calcium and vit D intake  Continue OCP's  Genetic counseling

## 2017-07-17 LAB — COMPREHENSIVE METABOLIC PANEL
ALK PHOS: 46 IU/L (ref 39–117)
ALT: 10 IU/L (ref 0–32)
AST: 15 IU/L (ref 0–40)
Albumin/Globulin Ratio: 1.9 (ref 1.2–2.2)
Albumin: 4.8 g/dL (ref 3.5–5.5)
BUN / CREAT RATIO: 12 (ref 9–23)
BUN: 11 mg/dL (ref 6–20)
Bilirubin Total: 0.6 mg/dL (ref 0.0–1.2)
CALCIUM: 9.8 mg/dL (ref 8.7–10.2)
CO2: 25 mmol/L (ref 20–29)
CREATININE: 0.94 mg/dL (ref 0.57–1.00)
Chloride: 100 mmol/L (ref 96–106)
GFR calc Af Amer: 88 mL/min/{1.73_m2} (ref >59)
GFR, EST NON AFRICAN AMERICAN: 77 mL/min/{1.73_m2} (ref >59)
GLOBULIN, TOTAL: 2.5 g/dL (ref 1.5–4.5)
GLUCOSE: 89 mg/dL (ref 65–99)
Potassium: 5.1 mmol/L (ref 3.5–5.2)
Sodium: 141 mmol/L (ref 134–144)
Total Protein: 7.3 g/dL (ref 6.0–8.5)

## 2017-07-17 LAB — CBC
HEMATOCRIT: 44.9 % (ref 34.0–46.6)
HEMOGLOBIN: 15.2 g/dL (ref 11.1–15.9)
MCH: 31.5 pg (ref 26.6–33.0)
MCHC: 33.9 g/dL (ref 31.5–35.7)
MCV: 93 fL (ref 79–97)
Platelets: 429 10*3/uL — ABNORMAL HIGH (ref 150–379)
RBC: 4.82 x10E6/uL (ref 3.77–5.28)
RDW: 13.8 % (ref 12.3–15.4)
WBC: 7.5 10*3/uL (ref 3.4–10.8)

## 2017-07-17 LAB — LIPID PANEL
CHOL/HDL RATIO: 2.6 ratio (ref 0.0–4.4)
CHOLESTEROL TOTAL: 208 mg/dL — AB (ref 100–199)
HDL: 81 mg/dL (ref >39)
LDL CALC: 108 mg/dL — AB (ref 0–99)
TRIGLYCERIDES: 94 mg/dL (ref 0–149)
VLDL CHOLESTEROL CAL: 19 mg/dL (ref 5–40)

## 2017-07-17 LAB — VITAMIN D 25 HYDROXY (VIT D DEFICIENCY, FRACTURES): Vit D, 25-Hydroxy: 95.4 ng/mL (ref 30.0–100.0)

## 2017-07-18 ENCOUNTER — Other Ambulatory Visit: Payer: Self-pay | Admitting: Obstetrics and Gynecology

## 2017-07-24 MED ORDER — NORETHINDRONE ACET-ETHINYL EST 1.5-30 MG-MCG PO TABS: 1.0000 | ORAL_TABLET | Freq: Every day | ORAL | 3 refills | Status: DC

## 2017-07-24 NOTE — Telephone Encounter (Signed)
Medication refill request: OCP Last AEX:  07/16/17 JJ Next AEX: none Last MMG (if hormonal medication request): none Refill authorized: 07/16/17 #3packs/3R to [facility]   Rx sent to [facility] as prescribed by JJ.

## 2017-07-24 NOTE — Telephone Encounter (Signed)
Patient is calling regarding her denied prescription request from [facility]. Patient said the original prescription was sent to [facility]. Patent would like a 3 month with 3 refills sent to her mail order.

## 2017-10-07 ENCOUNTER — Other Ambulatory Visit: Payer: BLUE CROSS/BLUE SHIELD

## 2017-10-07 ENCOUNTER — Ambulatory Visit (HOSPITAL_BASED_OUTPATIENT_CLINIC_OR_DEPARTMENT_OTHER): Payer: BLUE CROSS/BLUE SHIELD | Admitting: Genetic Counselor

## 2017-10-07 ENCOUNTER — Encounter: Payer: Self-pay | Admitting: Genetic Counselor

## 2017-10-07 DIAGNOSIS — Z800 Family history of malignant neoplasm of digestive organs: Secondary | ICD-10-CM | POA: Diagnosis not present

## 2017-10-07 DIAGNOSIS — Z8041 Family history of malignant neoplasm of ovary: Secondary | ICD-10-CM

## 2017-10-07 DIAGNOSIS — Z808 Family history of malignant neoplasm of other organs or systems: Secondary | ICD-10-CM | POA: Insufficient documentation

## 2017-10-07 DIAGNOSIS — Z8042 Family history of malignant neoplasm of prostate: Secondary | ICD-10-CM | POA: Insufficient documentation

## 2017-10-07 DIAGNOSIS — Z809 Family history of malignant neoplasm, unspecified: Secondary | ICD-10-CM | POA: Diagnosis not present

## 2017-10-07 NOTE — Progress Notes (Signed)
REFERRING PROVIDER: Salvadore Dom, [address], [place] [address]  PRIMARY PROVIDER:  Patient, [name]  PRIMARY REASON FOR VISIT:  1. Family history of colon cancer   2. Family history of melanoma   3. Family history of prostate cancer   4. Family history of ovarian cancer      HISTORY OF PRESENT ILLNESS:   Pamela Villegas, a 41 y.o. female, was seen for a [place] cancer genetics consultation at the request of Dr. Talbert Nan due to a family history of cancer.  Pamela Villegas presents to clinic today to discuss the possibility of a hereditary predisposition to cancer, genetic testing, and to further clarify her future cancer risks, as well as potential cancer risks for family members. Pamela Villegas is a 41 y.o. female with no personal history of cancer.  Pamela Villegas has not had a menstrual period in 2 years, she feels that most likely it is due to ovarian cysts.  CANCER HISTORY:   No history exists.     HORMONAL RISK FACTORS:  Menarche was at age 40.  First live birth at age N/A.  OCP use for approximately on and off for 23 years.  Ovaries intact: yes.  Hysterectomy: no.  Menopausal status: premenopausal.  HRT use: 0 years. Colonoscopy: no; not examined. Mammogram within the last year: no. Number of breast biopsies: 1. Up to date with pelvic exams:  yes. Any excessive radiation exposure in the past:  no  Past Medical History:  Diagnosis Date  . Anxiety   . Depression   . Family history of colon cancer   . Family history of melanoma   . Family history of ovarian cancer   . Family history of prostate cancer     Past Surgical History:  Procedure Laterality Date  . ANKLE SURGERY Right 2000  . [place] EXTRACTION  1998    Social History   Socioeconomic History  . Marital status: Divorced    Spouse name: Not on file  . Number of children: 0  . Years of education: Not on file  . Highest education level: Not on file  Social Needs  . Financial  resource strain: Not on file  . Food insecurity - worry: Not on file  . Food insecurity - inability: Not on file  . Transportation needs - medical: Not on file  . Transportation needs - non-medical: Not on file  Occupational History  . Not on file  Tobacco Use  . Smoking status: Never Smoker  . Smokeless tobacco: Never Used  Substance and Sexual Activity  . Alcohol use: Yes    Comment: ocassional   . Drug use: No  . Sexual activity: Yes    Partners: Female    Birth control/protection: Pill  Other Topics Concern  . Not on file  Social History Narrative  . Not on file     FAMILY HISTORY:  We obtained a detailed, 4-generation family history.  Significant diagnoses are listed below: Family History  Problem Relation Age of Onset  . Ovarian cancer Mother 47       ovarian cancer  . Lung cancer Father 69       Lung cancer  . Osteoporosis Maternal Grandmother   . Colon cancer Maternal Grandfather        possibly dx under 61  . Esophageal cancer Maternal Grandfather 73  . Parkinsonism Paternal Grandmother   . Melanoma Sister 57       back   .  Colon polyps Maternal Aunt   . Cervical cancer Maternal Aunt   . Colon cancer Maternal Uncle 1  . Prostate cancer Maternal Uncle 62    The patient does not have children.  She has two sisters.  One sister has issues with ovarian cysts and the other was diagnosed with melanoma at age 31.  Both parents are deceased.  The patient's father died of metastatic cancer, with an unknown primary.  He had lung, lymph node and brain cancer.  He had 5-6 siblings who did not have cancer.  The paternal grandparents are deceased.  The grandmother had Parkinson's disease and the grandfather had a heart attack.  The patient's mother was diagnosed with ovarian cancer at age 54 and died at 19.   She had three sister and two brothers.  One sister has colon polyps and had cervical cancer.  One brother had colon cancer at 85 and the other brother was diagnosed  with prostate cancer at 18.  Both maternal grandparents are deceased.  The grandfather had colon cancer, possibly under 73, and then esophageal cancer in his 68's.  The grandmother died from complications of a broken hip.  Pamela Villegas is unaware of previous family history of genetic testing for hereditary cancer risks. Patient's ancestors are of [place] and [place] descent. There is no reported Ashkenazi Jewish ancestry. There is no known consanguinity.  GENETIC COUNSELING ASSESSMENT: Pamela Villegas is a 41 y.o. female with a family history of ovarian, colon and prostate cancer and melanoma which is somewhat suggestive of a hereditary cancer syndrome and predisposition to cancer. We, therefore, discussed and recommended the following at today's visit.   DISCUSSION: We discussed that about 20-25% of ovarian cancer is hereditary with most cases due to BRCA mutations.  Other genes can also be associated with hereditary ovarian cancer, and based on the family history of colon and prostate cancer, it is most suggestive of Lynch syndrome.  We discussed that Lynch syndrome is associated with MMR genes, and that depending on the gene causing Lynch syndrome in a family will depend on the cancer risk.  Based on the patient's sister with melanoma, there are several genes that can be associated with melanoma syndromes.  Both BRCA mutations as well as Lynch syndrome, can increase the risk for Melanoma, but so can other genes.  We reviewed the characteristics, features and inheritance patterns of hereditary cancer syndromes. We also discussed genetic testing, including the appropriate family members to test, the process of testing, insurance coverage and turn-around-time for results. We discussed the implications of a negative, positive and/or variant of uncertain significant result. We recommended Pamela Villegas pursue genetic testing for the Multi-cancer gene panel. The Multi-Gene Panel offered by [facility] includes sequencing and/or  deletion duplication testing of the following 83 genes: ALK, APC, ATM, AXIN2,BAP1,  BARD1, BLM, BMPR1A, BRCA1, BRCA2, BRIP1, CASR, CDC73, CDH1, CDK4, CDKN1B, CDKN1C, CDKN2A (p14ARF), CDKN2A (p16INK4a), CEBPA, CHEK2, CTNNA1, DICER1, DIS3L2, EGFR (c.2369C>T, p.Thr790Met variant only), EPCAM (Deletion/duplication testing only), FH, FLCN, GATA2, GPC3, GREM1 (Promoter region deletion/duplication testing only), HOXB13 (c.251G>A, p.Gly84Glu), HRAS, KIT, MAX, MEN1, MET, MITF (c.952G>A, p.Glu318Lys variant only), MLH1, MSH2, MSH3, MSH6, MUTYH, NBN, NF1, NF2, NTHL1, PALB2, PDGFRA, PHOX2B, PMS2, POLD1, POLE, POT1, PRKAR1A, PTCH1, PTEN, RAD50, RAD51C, RAD51D, RB1, RECQL4, RET, RUNX1, SDHAF2, SDHA (sequence changes only), SDHB, SDHC, SDHD, SMAD4, SMARCA4, SMARCB1, SMARCE1, STK11, SUFU, TERT, TERT, TMEM127, TP53, TSC1, TSC2, VHL, WRN and WT1.    We discussed that some people do not want to undergo genetic  testing due to fear of genetic discrimination.  A federal law called the Genetic Information Non-Discrimination Act (GINA) of 2008 helps protect individuals against genetic discrimination based on their genetic test results.  It impacts both health insurance and employment.  With health insurance, it protects against increased premiums, being kicked off insurance or being forced to take a test in order to be insured.  For employment it protects against hiring, firing and promoting decisions based on genetic test results.  Health status due to a cancer diagnosis is not protected under GINA.  Based on Pamela Villegas's family history of cancer, she meets medical criteria for genetic testing. Despite that she meets criteria, she may still have an out of pocket cost. We discussed that if her out of pocket cost for testing is over $100, the laboratory will call and confirm whether she wants to proceed with testing.  If the out of pocket cost of testing is less than $100 she will be billed by the genetic testing laboratory.   Based on  the patient's personal and family history, statistical models (Tyrer Cusik)  and literature data were used to estimate her risk of developing breast cancer. These estimate her lifetime risk of developing breast cancer to be approximately 10.7%. This estimation does not take into account any genetic testing results.  The patient's lifetime breast cancer risk is a preliminary estimate based on available information using one of several models endorsed by the [place] (ACS). The ACS recommends consideration of breast MRI screening as an adjunct to mammography for patients at high risk (defined as 20% or greater lifetime risk). A more detailed breast cancer risk assessment can be considered, if clinically indicated. Pamela Villegas has been determined to be at average risk for breast cancer.    PLAN: After considering the risks, benefits, and limitations, Pamela Villegas  provided informed consent to pursue genetic testing and the blood sample was sent to [facility] for analysis of the Multi-cancer panel. Results should be available within approximately 2-3 weeks' time, at which point they will be disclosed by telephone to Pamela Villegas, as will any additional recommendations warranted by these results. Pamela Villegas will receive a summary of her genetic counseling visit and a copy of her results once available. This information will also be available in Epic. We encouraged Pamela Villegas to remain in contact with cancer genetics annually so that we can continuously update the family history and inform her of any changes in cancer genetics and testing that may be of benefit for her family. Pamela Villegas questions were answered to her satisfaction today. Our contact information was provided should additional questions or concerns arise.  Lastly, we encouraged Pamela Villegas to remain in contact with cancer genetics annually so that we can continuously update the family history and inform her of any changes in cancer  genetics and testing that may be of benefit for this family.   Ms.  Villegas questions were answered to her satisfaction today. Our contact information was provided should additional questions or concerns arise. Thank you for the referral and allowing [place] to share in the care of your patient.   Shaheem Pichon P. Florene Glen, [place], [facility] Certified Genetic Counselor [email or web address] phone: [phone]  The patient was seen for a total of 45 minutes in face-to-face genetic counseling.  This patient was discussed with Drs. Magrinat, Lindi Adie and/or Burr Medico who agrees with the above.    _______________________________________________________________________ For Office Staff:  Number of people involved in session: 1 Was an  Intern/ student involved with case: no

## 2017-10-30 ENCOUNTER — Encounter: Payer: Self-pay | Admitting: Genetic Counselor

## 2017-10-30 ENCOUNTER — Telehealth: Payer: Self-pay | Admitting: Genetic Counselor

## 2017-10-30 DIAGNOSIS — Z1379 Encounter for other screening for genetic and chromosomal anomalies: Secondary | ICD-10-CM | POA: Insufficient documentation

## 2017-10-30 NOTE — Telephone Encounter (Signed)
Revealed negative genetic testing.  Discussed that we do not know why there is cancer in the family. It could be due to a different gene that we are not testing, or maybe our current technology may not be able to pick something up.  It will be important for her to keep in contact with genetics to keep up with whether additional testing may be needed.  

## 2017-11-04 ENCOUNTER — Ambulatory Visit: Payer: Self-pay | Admitting: Genetic Counselor

## 2017-11-04 NOTE — Progress Notes (Signed)
HPI: [name] was previously seen in the [place] clinic due to a family history of cancer and concerns regarding a hereditary predisposition to cancer. Please refer to our prior cancer genetics clinic note for more information regarding [name]'s medical, social and family histories, and our assessment and recommendations, at the time. [name] recent genetic test results were disclosed to her, as were recommendations warranted by these results. These results and recommendations are discussed in more detail below.  CANCER HISTORY:   No history exists.    FAMILY HISTORY:  We obtained a detailed, 4-generation family history.  Significant diagnoses are listed below: Family History  Problem Relation Age of Onset  . Ovarian cancer Mother 26       ovarian cancer  . Lung cancer Father 23       Lung cancer  . Osteoporosis Maternal Grandmother   . Colon cancer Maternal Grandfather        possibly dx under 39  . Esophageal cancer Maternal Grandfather 5  . Parkinsonism Paternal Grandmother   . Melanoma Sister 72       back   . Colon polyps Maternal Aunt   . Cervical cancer Maternal Aunt   . Colon cancer Maternal Uncle 77  . Prostate cancer Maternal Uncle 62    The patient does not have children.  She has two sisters.  One sister has issues with ovarian cysts and the other was diagnosed with melanoma at age 33.  Both parents are deceased.  The patient's father died of metastatic cancer, with an unknown primary.  He had lung, lymph node and brain cancer.  He had 5-6 siblings who did not have cancer.  The paternal grandparents are deceased.  The grandmother had Parkinson's disease and the grandfather had a heart attack.  The patient's mother was diagnosed with ovarian cancer at age 65 and died at 71.   She had three sister and two brothers.  One sister has colon polyps and had cervical cancer.  One brother had colon cancer at 84 and the other brother was diagnosed with  prostate cancer at 61.  Both maternal grandparents are deceased.  The grandfather had colon cancer, possibly under 68, and then esophageal cancer in his 37's.  The grandmother died from complications of a broken hip.  [name] is unaware of previous family history of genetic testing for hereditary cancer risks. Patient's ancestors are of [place] and [place] descent. There is no reported Ashkenazi Jewish ancestry. There is no known consanguinity.  GENETIC TEST RESULTS: Genetic testing reported out on October 30, 2017 through the Multi-cancer panel found no deleterious mutations.  The Multi-Gene Panel offered by [facility] includes sequencing and/or deletion duplication testing of the following 83 genes: ALK, APC, ATM, AXIN2,BAP1,  BARD1, BLM, BMPR1A, BRCA1, BRCA2, BRIP1, CASR, CDC73, CDH1, CDK4, CDKN1B, CDKN1C, CDKN2A (p14ARF), CDKN2A (p16INK4a), CEBPA, CHEK2, CTNNA1, DICER1, DIS3L2, EGFR (c.2369C>T, p.Thr790Met variant only), EPCAM (Deletion/duplication testing only), FH, FLCN, GATA2, GPC3, GREM1 (Promoter region deletion/duplication testing only), HOXB13 (c.251G>A, p.Gly84Glu), HRAS, KIT, MAX, MEN1, MET, MITF (c.952G>A, p.Glu318Lys variant only), MLH1, MSH2, MSH3, MSH6, MUTYH, NBN, NF1, NF2, NTHL1, PALB2, PDGFRA, PHOX2B, PMS2, POLD1, POLE, POT1, PRKAR1A, PTCH1, PTEN, RAD50, RAD51C, RAD51D, RB1, RECQL4, RET, RUNX1, SDHAF2, SDHA (sequence changes only), SDHB, SDHC, SDHD, SMAD4, SMARCA4, SMARCB1, SMARCE1, STK11, SUFU, TERT, TERT, TMEM127, TP53, TSC1, TSC2, VHL, WRN and WT1.   The test report has been scanned into EPIC and is located under the Molecular Pathology section of the Results  Review tab.   We discussed with [name] that since the current genetic testing is not perfect, it is possible there may be a gene mutation in one of these genes that current testing cannot detect, but that chance is small. We also discussed, that it is possible that another gene that has not yet been discovered, or that we have  not yet tested, is responsible for the cancer diagnoses in the family, and it is, therefore, important to remain in touch with cancer genetics in the future so that we can continue to offer [name] the most up to date genetic testing.     CANCER SCREENING RECOMMENDATIONS:  This normal result is reassuring and indicates that [name] does not likely have an increased risk of cancer due to a mutation in one of these genes.  We, therefore, recommended  [name] continue to follow the cancer screening guidelines provided by her primary healthcare providers.   RECOMMENDATIONS FOR FAMILY MEMBERS: Women in this family might be at some increased risk of developing cancer, over the general population risk, simply due to the family history of cancer. We recommended women in this family have a yearly mammogram beginning at age 2, or 37 years younger than the earliest onset of cancer, an annual clinical breast exam, and perform monthly breast self-exams. Women in this family should also have a gynecological exam as recommended by their primary provider. All family members should have a colonoscopy by age 50.  Based on [name]'s family history, we recommended her sister, who was diagnosed with melanoma at age 46, have genetic counseling and testing. [name] will let us know if we can be of any assistance in coordinating genetic counseling and/or testing for this family member.   FOLLOW-UP: Lastly, we discussed with [name] that cancer genetics is a rapidly advancing field and it is possible that new genetic tests will be appropriate for her and/or her family members in the future. We encouraged her to remain in contact with cancer genetics on an annual basis so we can update her personal and family histories and let her know of advances in cancer genetics that may benefit this family.   Our contact number was provided. [name] questions were answered to her satisfaction, and she knows she is welcome  to call us at anytime with additional questions or concerns.   Roma Kayser, MS, [facility] Certified Genetic Counselor [email or web address]

## 2018-04-24 NOTE — Progress Notes (Signed)
[facility] [address], [address] [place], [place] [address] [phone]  Date:  04/26/2018   Name:  Pamela Villegas   DOB:  21-Dec-1975   MRN:  [id number]  PCP:  Pearline Cables, MD    Chief Complaint: New Patient (Initial Visit) (dot physical-told she had an irregular heart beat, skin tags) and Hand Numbness (right hand numbness)   History of Present Illness:  Pamela Villegas is a 42 y.o. very pleasant female patient who presents with the following:  Here today as a new patient to establish care She is a patient of Dr. 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   She has not noted anything herself, no palpitations  She has not noted any syncope No CP or abnormal SOB She does exercise on a regular basis and has not noted any problems there. She is able to work out vigorously- running and [employer]- with no symptoms   She has noted some skin tags recently on her chest and neck which bother her a bit   Also she has noted her right hand going numb "forever," at least 5 years or more Will come and go, she is not aware of any positional aspect of this symptom  Generally better if she will exercise on a regular basis Just the hand goes numb, not the arm.  However she may have some pain in her right shoulder.  This is also not new.  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 Genetic testing 10/30/2017  . Family history of melanoma   . Family history of prostate cancer   . Family history of ovarian cancer 05/26/2017  . Family history of colon cancer 05/26/2017  . Chronic female pelvic pain 04/10/2017  . Cyst of right ovary 04/10/2017  . Pain of right hip joint 04/10/2017    Past Medical History:  Diagnosis Date  . Anxiety   . Chicken pox   . Depression   . Family history of colon cancer   . Family history of melanoma   . Family history of ovarian cancer   . Family history of prostate cancer   . Hyperlipidemia   . Urinary tract infection     Past Surgical History:  Procedure Laterality Date  . ANKLE SURGERY Right 2000  . BREAST BIOPSY  2002  . WISDOM TOOTH EXTRACTION  1998    Social History   Tobacco Use  . Smoking status: Never Smoker  . Smokeless tobacco: Never Used  Substance Use Topics  . Alcohol use: Yes    Comment: ocassional   . Drug use: No    Family History  Problem Relation Age  of Onset  . Ovarian cancer Mother 46       ovarian cancer  . Lung cancer Father 28       Lung cancer  . Osteoporosis Maternal Grandmother   . Colon cancer Maternal Grandfather        possibly dx under 50  . Esophageal cancer Maternal Grandfather 21  . Parkinsonism Paternal Grandmother   . Melanoma Sister 36       back   . Colon polyps Maternal Aunt   . Cervical cancer Maternal Aunt   . Colon cancer Maternal Uncle 65  . Prostate cancer Maternal Uncle 62    No Known Allergies  Medication list has been reviewed and updated.  Current Outpatient Medications on File Prior to Visit  Medication Sig Dispense Refill  . b complex vitamins capsule Take 1 capsule by mouth daily.    . Cholecalciferol (VITAMIN D) 2000 units tablet Take 2,000 Units by mouth daily.    . Coenzyme Q10-Fish Oil-Vit E (CO-Q 10 OMEGA-3 FISH OIL PO) Take by mouth.    .  Multiple Vitamin (MULTI-VITAMIN DAILY PO) Take by mouth daily.    . Omega-3 1000 MG CAPS Take 1 capsule by mouth daily.     No current facility-administered medications on file prior to visit.     Review of Systems:  As per HPI- otherwise negative. No fever or chills Mood is good, normal Energy level is good Normal/ good exercise tolerance   Physical Examination: Vitals:   04/26/18 1055  BP: 116/74  Pulse: 68  Resp: 16  SpO2: 98%   Vitals:   04/26/18 1055  Weight: 144 lb 3.2 oz (65.4 kg)  Height: 5\' 4"  (1.626 m)   Body mass index is 24.75 kg/m. Ideal Body Weight: Weight in (lb) to have BMI = 25: 145.3  GEN: WDWN, NAD, Non-toxic, A & O x 3, looks well, fit build  HEENT: Atraumatic, Normocephalic. Neck supple. No masses, No LAD.  Bilateral TM wnl, oropharynx normal.  PEERL,EOMI.   She points out "skin tags" around her neck which are tiny, ?just normal varications in skin texture.  Advised that I am not able to remove these, but she might see derm for their opinion  Ears and Nose: No external deformity. CV: RRR, No M/G/R. No JVD. No thrill. No extra heart sounds. PULM: CTA B, no wheezes, crackles, rhonchi. No retractions. No resp. distress. No accessory muscle use. ABD: S, NT, ND, +BS. No rebound. No HSM. EXTR: No c/c/e NEURO Normal gait.  PSYCH: Normally interactive. Conversant. Not depressed or anxious appearing.  Calm demeanor.  Normal strength and DTR of both UE Cannot reproduce numbness in right hand with CTS testing or neck manipulation Right shoulder is tender over the anterior humerus at RCT insertion. Normal ROM of shoulder however   EKG: sinus brady with rate of 55.  Not concerning, no arrhythmia noted  Assessment and Plan: Pulse irregularity - Plan: EKG 12-Lead  Numbness and tingling in right hand - Plan: Ambulatory referral to Sports Medicine  Chronic right shoulder pain - Plan: Ambulatory referral to Sports Medicine  Screening for breast cancer - Plan:  MM 3D SCREEN BREAST BILATERAL  Discussed irregular pulse in detail with pt.  We are not sure exactly what was noted at her DOT exam. She has no sx and EKG is normal today. Offered echo, holter.  For the time being she prefers to observe. Will let me know if any recurrent sx, any change in exercise tolerance, etc  Referral to sports med for her shoulder and hand Try CT splint at night Referral for her first mamm which is due now/ in a few months She will see me in the fall for labs, etc   Signed Abbe AmsterdamJessica Sheriann Newmann, MD

## 2018-04-26 ENCOUNTER — Ambulatory Visit: Payer: BLUE CROSS/BLUE SHIELD | Admitting: Family Medicine

## 2018-04-26 ENCOUNTER — Encounter: Payer: Self-pay | Admitting: Family Medicine

## 2018-04-26 ENCOUNTER — Encounter

## 2018-04-26 VITALS — BP 116/74 | HR 68 | Resp 16 | Ht 64.0 in | Wt 144.2 lb

## 2018-04-26 DIAGNOSIS — R202 Paresthesia of skin: Secondary | ICD-10-CM

## 2018-04-26 DIAGNOSIS — Z1239 Encounter for other screening for malignant neoplasm of breast: Secondary | ICD-10-CM

## 2018-04-26 DIAGNOSIS — R0989 Other specified symptoms and signs involving the circulatory and respiratory systems: Secondary | ICD-10-CM | POA: Diagnosis not present

## 2018-04-26 DIAGNOSIS — Z1231 Encounter for screening mammogram for malignant neoplasm of breast: Secondary | ICD-10-CM | POA: Diagnosis not present

## 2018-04-26 DIAGNOSIS — R200 Anesthesia of skin: Secondary | ICD-10-CM

## 2018-04-26 DIAGNOSIS — G8929 Other chronic pain: Secondary | ICD-10-CM

## 2018-04-26 DIAGNOSIS — M25511 Pain in right shoulder: Secondary | ICD-10-CM

## 2018-04-26 NOTE — Patient Instructions (Addendum)
Your EKG looks good today- please let me know if you have any other concerns or any cardiac symptoms I will set you up to see [facility] about your right shoulder issue You might try an OTC wrist splint at night- one that won't let you flex your wrist. If your hand numbness if related to median never compression (carpal tunnel) symptoms.    Please come and see me in the fall for a fasting visit and labs

## 2018-05-14 ENCOUNTER — Ambulatory Visit: Payer: BLUE CROSS/BLUE SHIELD | Admitting: Family Medicine

## 2018-05-14 ENCOUNTER — Encounter: Payer: Self-pay | Admitting: Family Medicine

## 2018-05-14 DIAGNOSIS — G5601 Carpal tunnel syndrome, right upper limb: Secondary | ICD-10-CM

## 2018-05-14 DIAGNOSIS — M25511 Pain in right shoulder: Secondary | ICD-10-CM | POA: Diagnosis not present

## 2018-05-14 NOTE — Patient Instructions (Signed)
You have rotator cuff impingement Try to avoid painful activities (overhead activities, lifting with extended arm) as much as possible. Advil 3 tabs three times a day with food for pain and inflammation - try the exercises first and if after a couple weeks you're still struggling then start this. Can take tylenol in addition to this. Subacromial injection may be beneficial to help with pain and to decrease inflammation. Consider physical therapy with transition to home exercise program. Do home exercise program with theraband and scapular stabilization exercises daily 3 sets of 10 once a day. If not improving at follow-up we will consider further imaging, injection, physical therapy, and/or nitro patches. Follow up with me in 6 weeks.  You have carpal tunnel syndrome. Wear the wrist brace at nighttime for next 6 weeks. Advil if needed as noted above. A prednisone dose pack is a consideration. Corticosteroid injection is a consideration to help with pain and inflammation

## 2018-05-17 ENCOUNTER — Encounter: Payer: Self-pay | Admitting: Family Medicine

## 2018-05-17 DIAGNOSIS — M25511 Pain in right shoulder: Secondary | ICD-10-CM | POA: Insufficient documentation

## 2018-05-17 DIAGNOSIS — G5601 Carpal tunnel syndrome, right upper limb: Secondary | ICD-10-CM | POA: Insufficient documentation

## 2018-05-17 NOTE — Assessment & Plan Note (Signed)
2/2 rotator cuff impingement.  Shown home exercises to do daily.  Consider advil if not improving.  Consider injection, physical therapy, nitro patches.  F/u in 6 weeks.

## 2018-05-17 NOTE — Assessment & Plan Note (Signed)
mild.  No evidence of radiculopathy.  Wrist brace at nighttime.  Advil if needed.  Consider injection, prednisone dose pack.  F/u in 6 weeks.

## 2018-05-17 NOTE — Progress Notes (Signed)
PCP and consultation requested by: Copland, Gwenlyn FoundJessica C, MD  Subjective:   HPI: Patient is a 42 y.o. female here for right shoulder pain, right hand numbness.  Patient reports she's had off and on problems with her right shoulder and right hand. She works as a Naval architecttruck driver and more recently pain in shoulder has been more consistent. Bothers her when shifting her truck - she has to sit more forward and causes her to do a Hawkins-type maneuver when shifting. Pain is 2/10. She is right handed. She also gets numbness into her right hand into all fingers that is off and on and better with exercise. Improves with putting hand above head. No skin changes.  Past Medical History:  Diagnosis Date  . Anxiety   . Chicken pox   . Depression   . Family history of colon cancer   . Family history of melanoma   . Family history of ovarian cancer   . Family history of prostate cancer   . Hyperlipidemia   . Urinary tract infection     Current Outpatient Medications on File Prior to Visit  Medication Sig Dispense Refill  . b complex vitamins capsule Take 1 capsule by mouth daily.    . Cholecalciferol (VITAMIN D) 2000 units tablet Take 2,000 Units by mouth daily.    . Coenzyme Q10-Fish Oil-Vit E (CO-Q 10 OMEGA-3 FISH OIL PO) Take by mouth.    . Multiple Vitamin (MULTI-VITAMIN DAILY PO) Take by mouth daily.    . Omega-3 1000 MG CAPS Take 1 capsule by mouth daily.     No current facility-administered medications on file prior to visit.     Past Surgical History:  Procedure Laterality Date  . ANKLE SURGERY Right 2000  . BREAST BIOPSY  2002  . WISDOM TOOTH EXTRACTION  1998    No Known Allergies  Social History   Socioeconomic History  . Marital status: Divorced    Spouse name: Not on file  . Number of children: 0  . Years of education: Not on file  . Highest education level: Not on file  Occupational History  . Not on file  Social Needs  . Financial resource strain: Not on file  .  Food insecurity:    Worry: Not on file    Inability: Not on file  . Transportation needs:    Medical: Not on file    Non-medical: Not on file  Tobacco Use  . Smoking status: Never Smoker  . Smokeless tobacco: Never Used  Substance and Sexual Activity  . Alcohol use: Yes    Comment: ocassional   . Drug use: No  . Sexual activity: Yes    Partners: Female  Lifestyle  . Physical activity:    Days per week: Not on file    Minutes per session: Not on file  . Stress: Not on file  Relationships  . Social connections:    Talks on phone: Not on file    Gets together: Not on file    Attends religious service: Not on file    Active member of club or organization: Not on file    Attends meetings of clubs or organizations: Not on file    Relationship status: Not on file  . Intimate partner violence:    Fear of current or ex partner: Not on file    Emotionally abused: Not on file    Physically abused: Not on file    Forced sexual activity: Not on file  Other Topics  Concern  . Not on file  Social History Narrative  . Not on file    Family History  Problem Relation Age of Onset  . Ovarian cancer Mother 70       ovarian cancer  . Lung cancer Father 71       Lung cancer  . Osteoporosis Maternal Grandmother   . Colon cancer Maternal Grandfather        possibly dx under 50  . Esophageal cancer Maternal Grandfather 90  . Parkinsonism Paternal Grandmother   . Melanoma Sister 36       back   . Colon polyps Maternal Aunt   . Cervical cancer Maternal Aunt   . Colon cancer Maternal Uncle 65  . Prostate cancer Maternal Uncle 62    BP (!) 148/75   Pulse (!) 58   Ht 5\' 4"  (1.626 m)   Wt 143 lb (64.9 kg)   BMI 24.55 kg/m   Review of Systems: See HPI above.     Objective:  Physical Exam:  Gen: NAD, comfortable in exam room  Right shoulder: No swelling, ecchymoses.  No gross deformity. No TTP. FROM. Positive Hawkins, negative Neers. Negative Yergasons. Strength 5/5 with  empty can and resisted internal/external rotation.  Mild pain empty can. Negative apprehension. NV intact distally.  Left shoulder: No swelling, ecchymoses.  No gross deformity. No TTP. FROM. Strength 5/5 with empty can and resisted internal/external rotation. NV intact distally.  Right hand/wrist: No deformity. FROM with 5/5 strength. No tenderness to palpation. NVI distally. Negative tinels.  Assessment & Plan:  1. Right shoulder pain - 2/2 rotator cuff impingement.  Shown home exercises to do daily.  Consider advil if not improving.  Consider injection, physical therapy, nitro patches.  F/u in 6 weeks.  2. Carpal tunnel syndrome - mild.  No evidence of radiculopathy.  Wrist brace at nighttime.  Advil if needed.  Consider injection, prednisone dose pack.  F/u in 6 weeks.

## 2018-07-07 ENCOUNTER — Ambulatory Visit (HOSPITAL_BASED_OUTPATIENT_CLINIC_OR_DEPARTMENT_OTHER): Payer: BLUE CROSS/BLUE SHIELD

## 2018-07-08 ENCOUNTER — Ambulatory Visit (HOSPITAL_BASED_OUTPATIENT_CLINIC_OR_DEPARTMENT_OTHER)
Admission: RE | Admit: 2018-07-08 | Discharge: 2018-07-08 | Disposition: A | Discharge status: Home / Self Care | Payer: BLUE CROSS/BLUE SHIELD | Source: Ambulatory Visit | Attending: Family Medicine | Admitting: Family Medicine

## 2018-07-08 ENCOUNTER — Encounter: Payer: Self-pay | Admitting: Family Medicine

## 2018-07-08 ENCOUNTER — Ambulatory Visit: Payer: BLUE CROSS/BLUE SHIELD | Admitting: Family Medicine

## 2018-07-08 VITALS — BP 124/80 | HR 47 | Ht 64.0 in | Wt 143.0 lb

## 2018-07-08 DIAGNOSIS — M25511 Pain in right shoulder: Secondary | ICD-10-CM | POA: Diagnosis not present

## 2018-07-08 DIAGNOSIS — Z1231 Encounter for screening mammogram for malignant neoplasm of breast: Secondary | ICD-10-CM | POA: Diagnosis not present

## 2018-07-08 DIAGNOSIS — Z1239 Encounter for other screening for malignant neoplasm of breast: Secondary | ICD-10-CM

## 2018-07-08 NOTE — Patient Instructions (Signed)
You're doing great! Try the exercises with the yellow theraband instead - try 1 set of 10 once a day and advance from here. Tylenol, ibuprofen only if needed. Consider nitro patches, physical therapy if you don't continue to improve. Follow up with me as needed otherwise.

## 2018-07-08 NOTE — Progress Notes (Signed)
PCP and consultation requested by: Copland, Gwenlyn Found, MD  Subjective:   HPI: Patient is a 42 y.o. female here for right shoulder pain, right hand numbness.  7/12: Patient reports she's had off and on problems with her right shoulder and right hand. 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 Bothers her when shifting her truck - she has to sit more forward and causes her to do a Hawkins-type maneuver when shifting. Pain is 2/10. She is right handed. She also gets numbness into her right hand into all fingers that is off and on and better with exercise. Improves with putting hand above head. No skin changes.  9/5: Patient reports she's doing better. Being more conscious of what she's doing and avoiding painful activities. Pain is 2/10 and more dull. Motion is improved. Brace for wrist helped the numbness in her right hand - much better. Doing home exercises for her shoulder seemed to worsen pain. No skin changes.  Past Medical History:  Diagnosis Date  . Anxiety   . Chicken pox   . Depression   . Family history of colon cancer   . Family history of melanoma   . Family history of ovarian cancer   . Family history of prostate cancer   . Hyperlipidemia   . Urinary tract infection     Current Outpatient Medications on File Prior to Visit  Medication Sig Dispense Refill  . Norethindrone Acetate-Ethinyl Estradiol (JUNEL,LOESTRIN,MICROGESTIN) 1.5-30 MG-MCG tablet     . b complex vitamins capsule Take 1 capsule by mouth daily.    . Cholecalciferol (VITAMIN D) 2000 units tablet Take 2,000 Units by mouth daily.    . Coenzyme Q10-Fish Oil-Vit E (CO-Q 10 OMEGA-3 FISH OIL PO) Take by mouth.    . Multiple Vitamin (MULTI-VITAMIN DAILY PO) Take by mouth daily.    . Omega-3 1000 MG CAPS Take 1 capsule by mouth daily.     No current facility-administered medications on file prior to visit.     Past Surgical History:  Procedure Laterality  Date  . ANKLE SURGERY Right 2000  . BREAST BIOPSY  2002  . WISDOM TOOTH EXTRACTION  1998    No Known Allergies  Social History   Socioeconomic History  . Marital status: Divorced    Spouse name: Not on file  . Number of children: 0  . Years of education: Not on file  . Highest education level: Not on file  Occupational History  . Not on file  Social Needs  . Financial resource strain: Not on file  . Food insecurity:    Worry: Not on file    Inability: Not on file  . Transportation needs:    Medical: Not on file    Non-medical: Not on file  Tobacco Use  . Smoking status: Never Smoker  . Smokeless tobacco: Never Used  Substance and Sexual Activity  . Alcohol use: Yes    Comment: ocassional   . Drug use: No  . Sexual activity: Yes    Partners: Female  Lifestyle  . Physical activity:    Days per week: Not on file    Minutes per session: Not on file  . Stress: Not on file  Relationships  . Social connections:    Talks on phone: Not on file    Gets together: Not on file    Attends religious service: Not on file    Active member of club or organization: Not on  file    Attends meetings of clubs or organizations: Not on file    Relationship status: Not on file  . Intimate partner violence:    Fear of current or ex partner: Not on file    Emotionally abused: Not on file    Physically abused: Not on file    Forced sexual activity: Not on file  Other Topics Concern  . Not on file  Social History Narrative  . Not on file    Family History  Problem Relation Age of Onset  . Ovarian cancer Mother 96       ovarian cancer  . Lung cancer Father 20       Lung cancer  . Osteoporosis Maternal Grandmother   . Colon cancer Maternal Grandfather        possibly dx under 50  . Esophageal cancer Maternal Grandfather 32  . Parkinsonism Paternal Grandmother   . Melanoma Sister 36       back   . Colon polyps Maternal Aunt   . Cervical cancer Maternal Aunt   . Colon cancer  Maternal Uncle 65  . Prostate cancer Maternal Uncle 62    BP 124/80   Pulse (!) 47   Ht 5\' 4"  (1.626 m)   Wt 143 lb (64.9 kg)   LMP 07/04/2018   BMI 24.55 kg/m   Review of Systems: See HPI above.     Objective:  Physical Exam:  Gen: NAD, comfortable in exam room  Right shoulder: No swelling, ecchymoses.  No gross deformity. No TTP. FROM. Negative Hawkins, Neers. Negative Yergasons. Strength 5/5 with empty can and resisted internal/external rotation. Negative apprehension. NV intact distally.  Assessment & Plan:  1. Right shoulder pain - 2/2 rotator cuff impingement.  Clinically much improved.  Will try home exercises with lighter band.  Tylenol, ibuprofen only if needed.  Consider nitro, physical therapy if not improving.  F/u prn.

## 2018-07-14 NOTE — Progress Notes (Signed)
42 y.o. G0P0 Divorced White or Caucasian Not Hispanic or Latino female here for annual exam.   She has a FH of ovarian cancer, melanoma and colon cancer. Negative genetic testing. Not currently sexually active, off of OCP's.  Period Cycle (Days): 28 Period Duration (Days): 3-7 days  Period Pattern: Regular Menstrual Flow: Moderate, Light Menstrual Control: Tampon Menstrual Control Change Freq (Hours): changes tampon every 2-3 hours  Dysmenorrhea: None  Patient's last menstrual period was 07/04/2018.          Sexually active: No The current method of family planning is none.    Exercising: Yes.    run/ walking Smoker:  no  Health Maintenance: Pap:  06/12/2015 normal  History of abnormal Pap:  no MMG:  07/08/2018 BI-RADS CATEGORY  1: Negative. BMD:   never Colonoscopy: never TDaP:  Unsure, will do with her primary next week.  Gardasil: completed all 3    reports that she has never smoked. She has never used smokeless tobacco. She reports that she drinks alcohol. She reports that she does not use drugs. Still working as Product manager.   Past Medical History:  Diagnosis Date  . Anxiety   . Chicken pox   . Depression   . Family history of colon cancer   . Family history of melanoma   . Family history of ovarian cancer   . Family history of prostate cancer   . Hyperlipidemia   . Urinary tract infection   Mom had ovarian cancer, diagnosed at 22-44, died at 49 (in 110) MGF with colon cancer ? In his 30's, died in his late 72's of esophageal cancer.  Mom had 5 siblings, MU just diagnosed with stage 3 colon cancer, 65. The patient has had negative genetic testing.   Past Surgical History:  Procedure Laterality Date  . ANKLE SURGERY Right 2000  . BREAST BIOPSY  2002  . WISDOM TOOTH EXTRACTION  1998    Current Outpatient Medications  Medication Sig Dispense Refill  . b complex vitamins capsule Take 1 capsule by mouth daily.    . Cholecalciferol (VITAMIN D) 2000 units  tablet Take 2,000 Units by mouth daily.    . Coenzyme Q10-Fish Oil-Vit E (CO-Q 10 OMEGA-3 FISH OIL PO) Take by mouth.    . Multiple Vitamin (MULTI-VITAMIN DAILY PO) Take by mouth daily.    . Omega-3 1000 MG CAPS Take 1 capsule by mouth daily.     No current facility-administered medications for this visit.     Family History  Problem Relation Age of Onset  . Ovarian cancer Mother 85       ovarian cancer  . Lung cancer Father 82       Lung cancer  . Osteoporosis Maternal Grandmother   . Colon cancer Maternal Grandfather        possibly dx under 50  . Esophageal cancer Maternal Grandfather 16  . Parkinsonism Paternal Grandmother   . Melanoma Sister 36       back   . Colon polyps Maternal Aunt   . Cervical cancer Maternal Aunt   . Colon cancer Maternal Uncle 65  . Prostate cancer Maternal Uncle 62    Review of Systems  Constitutional: Negative.   HENT: Negative.   Eyes: Negative.   Respiratory: Negative.   Cardiovascular: Negative.   Gastrointestinal: Negative.   Endocrine: Negative.   Genitourinary: Negative.   Musculoskeletal: Negative.   Skin: Negative.   Allergic/Immunologic: Negative.   Neurological: Negative.   Hematological: Negative.  Psychiatric/Behavioral: Negative.   All other systems reviewed and are negative.   Exam:   BP 112/60 (BP Location: Right Arm, Patient Position: Sitting, Cuff Size: Normal)   Pulse 64   Resp 14   Ht 5' 3.75" (1.619 m)   Wt 147 lb (66.7 kg)   LMP 07/04/2018   BMI 25.43 kg/m   Weight change: @WEIGHTCHANGE @ Height:   Height: 5' 3.75" (161.9 cm)  Ht Readings from Last 3 Encounters:  07/19/18 5' 3.75" (1.619 m)  07/08/18 5\' 4"  (1.626 m)  05/14/18 5\' 4"  (1.626 m)    General appearance: alert, cooperative and appears stated age Head: Normocephalic, without obvious abnormality, atraumatic Neck: no adenopathy, supple, symmetrical, trachea midline and thyroid normal to inspection and palpation Lungs: clear to auscultation  bilaterally Cardiovascular: regular rate and rhythm Breasts: normal appearance, no masses or tenderness Abdomen: soft, non-tender; non distended,  no masses,  no organomegaly Extremities: extremities normal, atraumatic, no cyanosis or edema Skin: Skin color, texture, turgor normal. No rashes or lesions Lymph nodes: Cervical, supraclavicular, and axillary nodes normal. No abnormal inguinal nodes palpated Neurologic: Grossly normal   Pelvic: External genitalia:  no lesions              Urethra:  normal appearing urethra with no masses, tenderness or lesions              Bartholins and Skenes: normal                 Vagina: normal appearing vagina with normal color and discharge, no lesions              Cervix: no lesions               Bimanual Exam:  Uterus:  normal size, contour, position, consistency, mobility, non-tender              Adnexa: no mass, fullness, tenderness               Rectovaginal: Confirms               Anus:  normal sphincter tone, no lesions  Chaperone was present for exam.  A:  Well Woman with normal exam  FH of ovarian cancer, colon cancer, melanoma. Negative genetic testing    P:   Pap with hpv  Screening STD (she wants testing for everything)  Screening labs  Discussed breast self exam  Discussed calcium and vit D intake  Mammogram UTD  She is getting skin checks  Declines screening for ovarian cancer  Use condoms if sexually active

## 2018-07-19 ENCOUNTER — Encounter: Payer: Self-pay | Admitting: Obstetrics and Gynecology

## 2018-07-19 ENCOUNTER — Ambulatory Visit: Payer: BLUE CROSS/BLUE SHIELD | Admitting: Obstetrics and Gynecology

## 2018-07-19 ENCOUNTER — Other Ambulatory Visit (HOSPITAL_COMMUNITY)
Admission: RE | Admit: 2018-07-19 | Discharge: 2018-07-19 | Disposition: A | Discharge status: Home / Self Care | Payer: BLUE CROSS/BLUE SHIELD | Source: Ambulatory Visit | Attending: Obstetrics and Gynecology | Admitting: Obstetrics and Gynecology

## 2018-07-19 ENCOUNTER — Other Ambulatory Visit: Payer: Self-pay

## 2018-07-19 VITALS — BP 112/60 | HR 64 | Resp 14 | Ht 63.75 in | Wt 147.0 lb

## 2018-07-19 DIAGNOSIS — F329 Major depressive disorder, single episode, unspecified: Secondary | ICD-10-CM | POA: Diagnosis not present

## 2018-07-19 DIAGNOSIS — Z124 Encounter for screening for malignant neoplasm of cervix: Secondary | ICD-10-CM

## 2018-07-19 DIAGNOSIS — Z113 Encounter for screening for infections with a predominantly sexual mode of transmission: Secondary | ICD-10-CM

## 2018-07-19 DIAGNOSIS — L218 Other seborrheic dermatitis: Secondary | ICD-10-CM | POA: Diagnosis not present

## 2018-07-19 DIAGNOSIS — Z0000 Encounter for general adult medical examination without abnormal findings: Secondary | ICD-10-CM

## 2018-07-19 DIAGNOSIS — Z8041 Family history of malignant neoplasm of ovary: Secondary | ICD-10-CM

## 2018-07-19 DIAGNOSIS — F419 Anxiety disorder, unspecified: Secondary | ICD-10-CM | POA: Diagnosis not present

## 2018-07-19 DIAGNOSIS — Z01419 Encounter for gynecological examination (general) (routine) without abnormal findings: Secondary | ICD-10-CM | POA: Diagnosis not present

## 2018-07-19 DIAGNOSIS — D1801 Hemangioma of skin and subcutaneous tissue: Secondary | ICD-10-CM | POA: Diagnosis not present

## 2018-07-19 DIAGNOSIS — Z808 Family history of malignant neoplasm of other organs or systems: Secondary | ICD-10-CM

## 2018-07-19 DIAGNOSIS — D225 Melanocytic nevi of trunk: Secondary | ICD-10-CM | POA: Diagnosis not present

## 2018-07-19 DIAGNOSIS — Z800 Family history of malignant neoplasm of digestive organs: Secondary | ICD-10-CM

## 2018-07-19 DIAGNOSIS — L821 Other seborrheic keratosis: Secondary | ICD-10-CM | POA: Diagnosis not present

## 2018-07-19 DIAGNOSIS — L570 Actinic keratosis: Secondary | ICD-10-CM | POA: Diagnosis not present

## 2018-07-19 NOTE — Patient Instructions (Signed)
EXERCISE AND DIET:  We recommended that you start or continue a regular exercise program for good health. Regular exercise means any activity that makes your heart beat faster and makes you sweat.  We recommend exercising at least 30 minutes per day at least 3 days a week, preferably 4 or 5.  We also recommend a diet low in fat and sugar.  Inactivity, poor dietary choices and obesity can cause diabetes, heart attack, stroke, and kidney damage, among others.    ALCOHOL AND SMOKING:  Women should limit their alcohol intake to no more than 7 drinks/beers/glasses of wine (combined, not each!) per week. Moderation of alcohol intake to this level decreases your risk of breast cancer and liver damage. And of course, no recreational drugs are part of a healthy lifestyle.  And absolutely no smoking or even second hand smoke. Most people know smoking can cause heart and lung diseases, but did you know it also contributes to weakening of your bones? Aging of your skin?  Yellowing of your teeth and nails?  CALCIUM AND VITAMIN D:  Adequate intake of calcium and Vitamin D are recommended.  The recommendations for exact amounts of these supplements seem to change often, but generally speaking 600 mg of calcium (either carbonate or citrate) and 800 units of Vitamin D per day seems prudent. Certain women may benefit from higher intake of Vitamin D.  If you are among these women, your doctor will have told you during your visit.    PAP SMEARS:  Pap smears, to check for cervical cancer or precancers,  have traditionally been done yearly, although recent scientific advances have shown that most women can have pap smears less often.  However, every woman still should have a physical exam from her gynecologist every year. It will include a breast check, inspection of the vulva and vagina to check for abnormal growths or skin changes, a visual exam of the cervix, and then an exam to evaluate the size and shape of the uterus and  ovaries.  And after 40 years of age, a rectal exam is indicated to check for rectal cancers. We will also provide age appropriate advice regarding health maintenance, like when you should have certain vaccines, screening for sexually transmitted diseases, bone density testing, colonoscopy, mammograms, etc.   MAMMOGRAMS:  All women over 40 years old should have a yearly mammogram. Many facilities now offer a "3D" mammogram, which may cost around $50 extra out of pocket. If possible,  we recommend you accept the option to have the 3D mammogram performed.  It both reduces the number of women who will be called back for extra views which then turn out to be normal, and it is better than the routine mammogram at detecting truly abnormal areas.    COLONOSCOPY:  Colonoscopy to screen for colon cancer is recommended for all women at age 50.  We know, you hate the idea of the prep.  We agree, BUT, having colon cancer and not knowing it is worse!!  Colon cancer so often starts as a polyp that can be seen and removed at colonscopy, which can quite literally save your life!  And if your first colonoscopy is normal and you have no family history of colon cancer, most women don't have to have it again for 10 years.  Once every ten years, you can do something that may end up saving your life, right?  We will be happy to help you get it scheduled when you are ready.    Be sure to check your insurance coverage so you understand how much it will cost.  It may be covered as a preventative service at no cost, but you should check your particular policy.      Breast Self-Awareness Breast self-awareness means being familiar with how your breasts look and feel. It involves checking your breasts regularly and reporting any changes to your health care provider. Practicing breast self-awareness is important. A change in your breasts can be a sign of a serious medical problem. Being familiar with how your breasts look and feel allows  you to find any problems early, when treatment is more likely to be successful. All women should practice breast self-awareness, including women who have had breast implants. How to do a breast self-exam One way to learn what is normal for your breasts and whether your breasts are changing is to do a breast self-exam. To do a breast self-exam: Look for Changes  1. Remove all the clothing above your waist. 2. Stand in front of a mirror in a room with good lighting. 3. Put your hands on your hips. 4. Push your hands firmly downward. 5. Compare your breasts in the mirror. Look for differences between them (asymmetry), such as: ? Differences in shape. ? Differences in size. ? Puckers, dips, and bumps in one breast and not the other. 6. Look at each breast for changes in your skin, such as: ? Redness. ? Scaly areas. 7. Look for changes in your nipples, such as: ? Discharge. ? Bleeding. ? Dimpling. ? Redness. ? A change in position. Feel for Changes  Carefully feel your breasts for lumps and changes. It is best to do this while lying on your back on the floor and again while sitting or standing in the shower or tub with soapy water on your skin. Feel each breast in the following way:  Place the arm on the side of the breast you are examining above your head.  Feel your breast with the other hand.  Start in the nipple area and make  inch (2 cm) overlapping circles to feel your breast. Use the pads of your three middle fingers to do this. Apply light pressure, then medium pressure, then firm pressure. The light pressure will allow you to feel the tissue closest to the skin. The medium pressure will allow you to feel the tissue that is a little deeper. The firm pressure will allow you to feel the tissue close to the ribs.  Continue the overlapping circles, moving downward over the breast until you feel your ribs below your breast.  Move one finger-width toward the center of the body.  Continue to use the  inch (2 cm) overlapping circles to feel your breast as you move slowly up toward your collarbone.  Continue the up and down exam using all three pressures until you reach your armpit.  Write Down What You Find  Write down what is normal for each breast and any changes that you find. Keep a written record with breast changes or normal findings for each breast. By writing this information down, you do not need to depend only on memory for size, tenderness, or location. Write down where you are in your menstrual cycle, if you are still menstruating. If you are having trouble noticing differences in your breasts, do not get discouraged. With time you will become more familiar with the variations in your breasts and more comfortable with the exam. How often should I examine my breasts? Examine   your breasts every month. If you are breastfeeding, the best time to examine your breasts is after a feeding or after using a breast pump. If you menstruate, the best time to examine your breasts is 5-7 days after your period is over. During your period, your breasts are lumpier, and it may be more difficult to notice changes. When should I see my health care provider? See your health care provider if you notice:  A change in shape or size of your breasts or nipples.  A change in the skin of your breast or nipples, such as a reddened or scaly area.  Unusual discharge from your nipples.  A lump or thick area that was not there before.  Pain in your breasts.  Anything that concerns you.  This information is not intended to replace advice given to you by your health care provider. Make sure you discuss any questions you have with your health care provider. Document Released: 10/20/2005 Document Revised: 03/27/2016 Document Reviewed: 09/09/2015 [employer] Interactive Patient Education  2018 [employer]  

## 2018-07-20 LAB — COMPREHENSIVE METABOLIC PANEL
ALK PHOS: 56 IU/L (ref 39–117)
ALT: 14 IU/L (ref 0–32)
AST: 18 IU/L (ref 0–40)
Albumin/Globulin Ratio: 2.4 — ABNORMAL HIGH (ref 1.2–2.2)
Albumin: 4.6 g/dL (ref 3.5–5.5)
BUN/Creatinine Ratio: 16 (ref 9–23)
BUN: 13 mg/dL (ref 6–24)
Bilirubin Total: 0.3 mg/dL (ref 0.0–1.2)
CALCIUM: 9.4 mg/dL (ref 8.7–10.2)
CO2: 23 mmol/L (ref 20–29)
Chloride: 99 mmol/L (ref 96–106)
Creatinine, Ser: 0.81 mg/dL (ref 0.57–1.00)
GFR calc Af Amer: 105 mL/min/{1.73_m2} (ref >59)
GFR, EST NON AFRICAN AMERICAN: 91 mL/min/{1.73_m2} (ref >59)
GLOBULIN, TOTAL: 1.9 g/dL (ref 1.5–4.5)
Glucose: 78 mg/dL (ref 65–99)
POTASSIUM: 4.3 mmol/L (ref 3.5–5.2)
SODIUM: 139 mmol/L (ref 134–144)
Total Protein: 6.5 g/dL (ref 6.0–8.5)

## 2018-07-20 LAB — HSV(HERPES SIMPLEX VRS) I + II AB-IGG
HSV 1 Glycoprotein G Ab, IgG: 3.34 index — ABNORMAL HIGH (ref 0.00–0.90)
HSV 2 IgG, Type Spec: <0.91 index (ref 0.00–0.90)

## 2018-07-20 LAB — LIPID PANEL
CHOL/HDL RATIO: 3.2 ratio (ref 0.0–4.4)
Cholesterol, Total: 234 mg/dL — ABNORMAL HIGH (ref 100–199)
HDL: 73 mg/dL (ref >39)
LDL Calculated: 113 mg/dL — ABNORMAL HIGH (ref 0–99)
TRIGLYCERIDES: 241 mg/dL — AB (ref 0–149)
VLDL Cholesterol Cal: 48 mg/dL — ABNORMAL HIGH (ref 5–40)

## 2018-07-20 LAB — HEP, RPR, HIV PANEL
HEP B S AG: NEGATIVE
HIV SCREEN 4TH GENERATION: NONREACTIVE
RPR: NONREACTIVE

## 2018-07-20 LAB — CBC
Hematocrit: 39 % (ref 34.0–46.6)
Hemoglobin: 13.7 g/dL (ref 11.1–15.9)
MCH: 32.5 pg (ref 26.6–33.0)
MCHC: 35.1 g/dL (ref 31.5–35.7)
MCV: 93 fL (ref 79–97)
Platelets: 332 10*3/uL (ref 150–450)
RBC: 4.21 x10E6/uL (ref 3.77–5.28)
RDW: 12.9 % (ref 12.3–15.4)
WBC: 7.4 10*3/uL (ref 3.4–10.8)

## 2018-07-20 LAB — HEPATITIS C ANTIBODY

## 2018-07-21 LAB — CYTOLOGY - PAP
Chlamydia: NEGATIVE
DIAGNOSIS: NEGATIVE
HPV: NOT DETECTED
Neisseria Gonorrhea: NEGATIVE
Trichomonas: NEGATIVE

## 2018-07-23 ENCOUNTER — Telehealth: Payer: Self-pay | Admitting: *Deleted

## 2018-07-23 NOTE — Telephone Encounter (Signed)
-----   Message from Romualdo BolkJill Evelyn Jertson, MD sent at 07/23/2018 11:24 AM EDT ----- Please inform the patient that her lipid panel is abnormal.  I would recommend a mediterranean diet and regular exercise.  A mediterranean diet is high in fruits, vegetables, whole grains, fish, chicken, nuts, healthy fats (olive oil or canola oil). Low fat dairy. Limit butter, margarine, red meat and sweets.  Please set her up for a fasting lipid panel in 6 months.  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  The rest of her lab work was normal.  02 recall

## 2018-07-23 NOTE — Telephone Encounter (Signed)
Attempted to reach patient, unable to leave a voicemail for patient to return call.

## 2018-07-25 NOTE — Progress Notes (Addendum)
[facility] [address], [address] [place], [place] [address] [phone]  Date:  07/28/2018   Name:  Pamela Villegas   DOB:  [birth year]   MRN:  [id number]  PCP:  Pearline Cables, MD    Chief Complaint: Fasting Lab and Immunizations (tetanus)   History of Present Illness:  Pamela Villegas is a 42 y.o. very pleasant female patient who presents with the following:  Here today for fasting labs and a wellness visit  She saw her GYN Dr. Kristie Cowman earlier this month and had pap and STI screning: She has a FH of ovarian cancer, melanoma and colon cancer. Negative genetic testing. Not currently sexually active, off of OCP's.   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   She has not noted anything herself, no palpitations  She has not noted any syncope No CP or abnormal SOB She does exercise on a regular basis and has not noted any problems there. She is able to work out vigorously- running and [employer]- with no symptoms  Also she has noted her right hand going numb "forever," at least 5 years or more Will come and go, she is not aware of any positional aspect of this symptom  Generally better if she will exercise on a regular basis Just the hand goes numb, not the arm.  However she may have some pain in her right shoulder.  This is also not new.  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  Wanted to observe She has not had any recurrent sx, she is feeling well and exercising on a regular basis  Her GYN  also did a cholesterol panel, etc for her- I don't see any other labs that she might need However she was not fasting at her GYN visit  Flu: will do today  Tdap: she is really not sure, may have been 9 years or more ago; give tdap today mammo- she had this done, was told that she has dense breasts but ow normal   She would like to do a fasting cholestenol panel today   Patient Active Problem List   Diagnosis Date Noted  . Right shoulder pain 05/17/2018  . Right carpal tunnel syndrome 05/17/2018  . Genetic testing 10/30/2017  . Family history of melanoma   . Family history of prostate cancer   . Family history of ovarian cancer 05/26/2017  . Family history of colon cancer 05/26/2017  . Chronic female pelvic pain 04/10/2017  . Cyst of right ovary 04/10/2017  . Pain of right hip joint 04/10/2017    Past Medical History:  Diagnosis Date  . Anxiety   . Chicken pox   . Depression   . Family history of colon cancer   . Family history of melanoma   . Family history of ovarian cancer   . Family history of prostate cancer   . Hyperlipidemia   .  Urinary tract infection     Past Surgical History:  Procedure Laterality Date  . ANKLE SURGERY Right 2000  . BREAST BIOPSY  2002  . WISDOM TOOTH EXTRACTION  1998    Social History   Tobacco Use  . Smoking status: Never Smoker  . Smokeless tobacco: Never Used  Substance Use Topics  . Alcohol use: Yes    Comment: ocassional   . Drug use: No    Family History  Problem Relation Age of Onset  . Ovarian cancer Mother 5644       ovarian cancer  . Lung cancer Father 5662       Lung cancer  . Osteoporosis Maternal Grandmother   . Colon cancer Maternal Grandfather        possibly dx under 50  . Esophageal cancer Maternal Grandfather 589  . Parkinsonism Paternal Grandmother   . Melanoma Sister 36       back   . Colon polyps Maternal Aunt   . Cervical cancer Maternal Aunt   . Colon cancer Maternal Uncle 65  . Prostate cancer Maternal  Uncle 62    No Known Allergies  Medication list has been reviewed and updated.  Current Outpatient Medications on File Prior to Visit  Medication Sig Dispense Refill  . b complex vitamins capsule Take 1 capsule by mouth daily.    . Cholecalciferol (VITAMIN D) 2000 units tablet Take 2,000 Units by mouth daily.    . Coenzyme Q10-Fish Oil-Vit E (CO-Q 10 OMEGA-3 FISH OIL PO) Take by mouth.    . Multiple Vitamin (MULTI-VITAMIN DAILY PO) Take by mouth daily.    . Omega-3 1000 MG CAPS Take 1 capsule by mouth daily.    Marland Kitchen. triamcinolone (KENALOG) 0.025 % cream      No current facility-administered medications on file prior to visit.     Review of Systems:  As per HPI- otherwise negative. LMP 9/1  Physical Examination: Vitals:   07/28/18 0915  BP: 118/70  Pulse: 72  Resp: 16  SpO2: 98%   Vitals:   07/28/18 0915  Weight: 144 lb (65.3 kg)  Height: 5' 3.75" (1.619 m)   Body mass index is 24.91 kg/m. Ideal Body Weight: Weight in (lb) to have BMI = 25: 144.2  GEN: WDWN, NAD, Non-toxic, A & O x 3, looks well, fit build  HEENT: Atraumatic, Normocephalic. Neck supple. No masses, No LAD. Ears and Nose: No external deformity. CV: RRR, No M/G/R. No JVD. No thrill. No extra heart sounds. PULM: CTA B, no wheezes, crackles, rhonchi. No retractions. No resp. distress. No accessory muscle use. EXTR: No c/c/e NEURO Normal gait.  PSYCH: Normally interactive. Conversant. Not depressed or anxious appearing.  Calm demeanor.    Assessment and Plan: Screening for hyperlipidemia - Plan: Lipid panel  Immunization due - Plan: Tdap vaccine greater than or equal to 7yo IM, Flu Vaccine QUAD 36+ mos IM  Following up today Repeat lipids as she was not fasting at last check tdap and flu shots today Hand-out concerning health maint Follow-up in one year   Signed Abbe AmsterdamJessica Jeena Arnett, MD  Results for orders placed or performed in visit on 07/28/18  Lipid panel  Result Value Ref Range   Cholesterol  228 (H) 0 - 200 mg/dL   Triglycerides 16.190.0 0.0 - 149.0 mg/dL   HDL 09.6074.70 >45.40>39.00 mg/dL   VLDL 98.118.0 0.0 - 19.140.0 mg/dL   LDL Cholesterol 478135 (H) 0 - 99 mg/dL   Total CHOL/HDL Ratio 3  NonHDL 153.40    Message to pt regarding her lipids

## 2018-07-28 ENCOUNTER — Encounter: Payer: Self-pay | Admitting: Family Medicine

## 2018-07-28 ENCOUNTER — Ambulatory Visit: Payer: BLUE CROSS/BLUE SHIELD | Admitting: Family Medicine

## 2018-07-28 VITALS — BP 118/70 | HR 72 | Resp 16 | Ht 63.75 in | Wt 144.0 lb

## 2018-07-28 DIAGNOSIS — Z23 Encounter for immunization: Secondary | ICD-10-CM

## 2018-07-28 DIAGNOSIS — Z13220 Encounter for screening for lipoid disorders: Secondary | ICD-10-CM | POA: Diagnosis not present

## 2018-07-28 LAB — LIPID PANEL
CHOLESTEROL: 228 mg/dL — AB (ref 0–200)
HDL: 74.7 mg/dL (ref >39.00)
LDL Cholesterol: 135 mg/dL — ABNORMAL HIGH (ref 0–99)
NonHDL: 153.4
TRIGLYCERIDES: 90 mg/dL (ref 0.0–149.0)
Total CHOL/HDL Ratio: 3
VLDL: 18 mg/dL (ref 0.0–40.0)

## 2018-07-28 NOTE — Patient Instructions (Signed)
Flu and tetanus shots today I will be in touch with your cholesterol panel  Please see me in about one year, continue to take good care of your health!   Health Maintenance, Female Adopting a healthy lifestyle and getting preventive care can go a long way to promote health and wellness. Talk with your health care provider about what schedule of regular examinations is right for you. This is a good chance for you to check in with your provider about disease prevention and staying healthy. In between checkups, there are plenty of things you can do on your own. Experts have done a lot of research about which lifestyle changes and preventive measures are most likely to keep you healthy. Ask your health care provider for more information. Weight and diet Eat a healthy diet  Be sure to include plenty of vegetables, fruits, low-fat dairy products, and lean protein.  Do not eat a lot of foods high in solid fats, added sugars, or salt.  Get regular exercise. This is one of the most important things you can do for your health. ? Most adults should exercise for at least 150 minutes each week. The exercise should increase your heart rate and make you sweat (moderate-intensity exercise). ? Most adults should also do strengthening exercises at least twice a week. This is in addition to the moderate-intensity exercise.  Maintain a healthy weight  Body mass index (BMI) is a measurement that can be used to identify possible weight problems. It estimates body fat based on height and weight. Your health care provider can help determine your BMI and help you achieve or maintain a healthy weight.  For females 37 years of age and older: ? A BMI below 18.5 is considered underweight. ? A BMI of 18.5 to 24.9 is normal. ? A BMI of 25 to 29.9 is considered overweight. ? A BMI of 30 and above is considered obese.  Watch levels of cholesterol and blood lipids  You should start having your blood tested for lipids  and cholesterol at 40 years of age, then have this test every 5 years.  You may need to have your cholesterol levels checked more often if: ? Your lipid or cholesterol levels are high. ? You are older than 40 years of age. ? You are at high risk for heart disease.  Cancer screening Lung Cancer  Lung cancer screening is recommended for adults 43-33 years old who are at high risk for lung cancer because of a history of smoking.  A yearly low-dose CT scan of the lungs is recommended for people who: ? Currently smoke. ? Have quit within the past 15 years. ? Have at least a 30-pack-year history of smoking. A pack year is smoking an average of one pack of cigarettes a day for 1 year.  Yearly screening should continue until it has been 15 years since you quit.  Yearly screening should stop if you develop a health problem that would prevent you from having lung cancer treatment.  Breast Cancer  Practice breast self-awareness. This means understanding how your breasts normally appear and feel.  It also means doing regular breast self-exams. Let your health care provider know about any changes, no matter how small.  If you are in your 20s or 30s, you should have a clinical breast exam (CBE) by a health care provider every 1-3 years as part of a regular health exam.  If you are 83 or older, have a CBE every year. Also consider having  a breast X-ray (mammogram) every year.  If you have a family history of breast cancer, talk to your health care provider about genetic screening.  If you are at high risk for breast cancer, talk to your health care provider about having an MRI and a mammogram every year.  Breast cancer gene (BRCA) assessment is recommended for women who have family members with BRCA-related cancers. BRCA-related cancers include: ? Breast. ? Ovarian. ? Tubal. ? Peritoneal cancers.  Results of the assessment will determine the need for genetic counseling and BRCA1 and BRCA2  testing.  Cervical Cancer Your health care provider may recommend that you be screened regularly for cancer of the pelvic organs (ovaries, uterus, and vagina). This screening involves a pelvic examination, including checking for microscopic changes to the surface of your cervix (Pap test). You may be encouraged to have this screening done every 3 years, beginning at age 64.  For women ages 77-65, health care providers may recommend pelvic exams and Pap testing every 3 years, or they may recommend the Pap and pelvic exam, combined with testing for human papilloma virus (HPV), every 5 years. Some types of HPV increase your risk of cervical cancer. Testing for HPV may also be done on women of any age with unclear Pap test results.  Other health care providers may not recommend any screening for nonpregnant women who are considered low risk for pelvic cancer and who do not have symptoms. Ask your health care provider if a screening pelvic exam is right for you.  If you have had past treatment for cervical cancer or a condition that could lead to cancer, you need Pap tests and screening for cancer for at least 20 years after your treatment. If Pap tests have been discontinued, your risk factors (such as having a new sexual partner) need to be reassessed to determine if screening should resume. Some women have medical problems that increase the chance of getting cervical cancer. In these cases, your health care provider may recommend more frequent screening and Pap tests.  Colorectal Cancer  This type of cancer can be detected and often prevented.  Routine colorectal cancer screening usually begins at 40 years of age and continues through 40 years of age.  Your health care provider may recommend screening at an earlier age if you have risk factors for colon cancer.  Your health care provider may also recommend using home test kits to check for hidden blood in the stool.  A small camera at the end of a  tube can be used to examine your colon directly (sigmoidoscopy or colonoscopy). This is done to check for the earliest forms of colorectal cancer.  Routine screening usually begins at age 75.  Direct examination of the colon should be repeated every 5-10 years through 40 years of age. However, you may need to be screened more often if early forms of precancerous polyps or small growths are found.  Skin Cancer  Check your skin from head to toe regularly.  Tell your health care provider about any new moles or changes in moles, especially if there is a change in a mole's shape or color.  Also tell your health care provider if you have a mole that is larger than the size of a pencil eraser.  Always use sunscreen. Apply sunscreen liberally and repeatedly throughout the day.  Protect yourself by wearing long sleeves, pants, a wide-brimmed hat, and sunglasses whenever you are outside.  Heart disease, diabetes, and high blood pressure  High blood pressure causes heart disease and increases the risk of stroke. High blood pressure is more likely to develop in: ? People who have blood pressure in the high end of the normal range (130-139/85-89 mm Hg). ? People who are overweight or obese. ? People who are African American.  If you are 97-43 years of age, have your blood pressure checked every 3-5 years. If you are 34 years of age or older, have your blood pressure checked every year. You should have your blood pressure measured twice-once when you are at a hospital or clinic, and once when you are not at a hospital or clinic. Record the average of the two measurements. To check your blood pressure when you are not at a hospital or clinic, you can use: ? An automated blood pressure machine at a pharmacy. ? A home blood pressure monitor.  If you are between 39 years and 85 years old, ask your health care provider if you should take aspirin to prevent strokes.  Have regular diabetes screenings. This  involves taking a blood sample to check your fasting blood sugar level. ? If you are at a normal weight and have a low risk for diabetes, have this test once every three years after 40 years of age. ? If you are overweight and have a high risk for diabetes, consider being tested at a younger age or more often. Preventing infection Hepatitis B  If you have a higher risk for hepatitis B, you should be screened for this virus. You are considered at high risk for hepatitis B if: ? You were born in a country where hepatitis B is common. Ask your health care provider which countries are considered high risk. ? Your parents were born in a high-risk country, and you have not been immunized against hepatitis B (hepatitis B vaccine). ? You have HIV or AIDS. ? You use needles to inject street drugs. ? You live with someone who has hepatitis B. ? You have had sex with someone who has hepatitis B. ? You get hemodialysis treatment. ? You take certain medicines for conditions, including cancer, organ transplantation, and autoimmune conditions.  Hepatitis C  Blood testing is recommended for: ? Everyone born from [birth year] through 1963. ? Anyone with known risk factors for hepatitis C.  Sexually transmitted infections (STIs)  You should be screened for sexually transmitted infections (STIs) including gonorrhea and chlamydia if: ? You are sexually active and are younger than 40 years of age. ? You are older than 40 years of age and your health care provider tells you that you are at risk for this type of infection. ? Your sexual activity has changed since you were last screened and you are at an increased risk for chlamydia or gonorrhea. Ask your health care provider if you are at risk.  If you do not have HIV, but are at risk, it may be recommended that you take a prescription medicine daily to prevent HIV infection. This is called pre-exposure prophylaxis (PrEP). You are considered at risk if: ? You are  sexually active and do not regularly use condoms or know the HIV status of your partner(s). ? You take drugs by injection. ? You are sexually active with a partner who has HIV.  Talk with your health care provider about whether you are at high risk of being infected with HIV. If you choose to begin PrEP, you should first be tested for HIV. You should then be tested every 3 months  for as long as you are taking PrEP. Pregnancy  If you are premenopausal and you may become pregnant, ask your health care provider about preconception counseling.  If you may become pregnant, take 400 to 800 micrograms (mcg) of folic acid every day.  If you want to prevent pregnancy, talk to your health care provider about birth control (contraception). Osteoporosis and menopause  Osteoporosis is a disease in which the bones lose minerals and strength with aging. This can result in serious bone fractures. Your risk for osteoporosis can be identified using a bone density scan.  If you are 29 years of age or older, or if you are at risk for osteoporosis and fractures, ask your health care provider if you should be screened.  Ask your health care provider whether you should take a calcium or vitamin D supplement to lower your risk for osteoporosis.  Menopause may have certain physical symptoms and risks.  Hormone replacement therapy may reduce some of these symptoms and risks. Talk to your health care provider about whether hormone replacement therapy is right for you. Follow these instructions at home:  Schedule regular health, dental, and eye exams.  Stay current with your immunizations.  Do not use any tobacco products including cigarettes, chewing tobacco, or electronic cigarettes.  If you are pregnant, do not drink alcohol.  If you are breastfeeding, limit how much and how often you drink alcohol.  Limit alcohol intake to no more than 1 drink per day for nonpregnant women. One drink equals 12 ounces of  beer, 5 ounces of wine, or 1 ounces of hard liquor.  Do not use street drugs.  Do not share needles.  Ask your health care provider for help if you need support or information about quitting drugs.  Tell your health care provider if you often feel depressed.  Tell your health care provider if you have ever been abused or do not feel safe at home. This information is not intended to replace advice given to you by your health care provider. Make sure you discuss any questions you have with your health care provider. Document Released: 05/05/2011 Document Revised: 03/27/2016 Document Reviewed: 07/24/2015 Elsevier Interactive Patient Education  [employer].

## 2018-09-20 DIAGNOSIS — D2239 Melanocytic nevi of other parts of face: Secondary | ICD-10-CM | POA: Diagnosis not present

## 2018-09-20 DIAGNOSIS — D485 Neoplasm of uncertain behavior of skin: Secondary | ICD-10-CM | POA: Diagnosis not present

## 2018-09-20 DIAGNOSIS — L570 Actinic keratosis: Secondary | ICD-10-CM | POA: Diagnosis not present

## 2019-07-18 NOTE — Progress Notes (Signed)
43 y.o. G0P0 Divorced White or Caucasian Not Hispanic or Latino female here for annual exam.  Not sexually active in the last year.   She has a FH of ovarian cancer, melanoma and colon cancer. Negative genetic testing. TC breast cancer risk is 10.7%.  Period Cycle (Days): 28 Period Duration (Days): 3-4 Period Pattern: Regular Menstrual Flow: Moderate Menstrual Control: Maxi pad Menstrual Control Change Freq (Hours): 2 Dysmenorrhea: (!) Mild Dysmenorrhea Symptoms: (back cramps)     Patient's last menstrual period was 07/03/2019.          Sexually active: No.  The current method of family planning is abstinence.    Exercising: Yes.    weights and running Smoker:  no  Health Maintenance: Pap:  07/19/2018 WNL NEG HPV, 06/12/2015 normal  History of abnormal Pap:  no MMG:  07/08/2018 BI-RADS CATEGORY 1: Negative. BMD:   never Colonoscopy: never TDaP:  07/28/2018 Gardasil: completed all 3    reports that she has never smoked. She has never used smokeless tobacco. She reports previous alcohol use. She reports that she does not use drugs. 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 Still working as Training and development officer.   Past Medical History:  Diagnosis Date  . Anxiety   . Chicken pox   . Depression   . Family history of colon cancer   . Family history of melanoma   . Family history of ovarian cancer   . Family history of prostate cancer   . Hyperlipidemia   . Urinary tract infection     Past Surgical History:  Procedure Laterality Date  . ANKLE SURGERY Right 2000  . BREAST BIOPSY  2002  . WISDOM TOOTH EXTRACTION  1998    Current Outpatient Medications  Medication Sig Dispense Refill  . b complex vitamins capsule Take 1 capsule by mouth daily.    . Coenzyme Q10-Fish Oil-Vit E (CO-Q 10 OMEGA-3 FISH OIL PO) Take by mouth.    . fluorouracil (EFUDEX) 5 % cream     . Multiple Vitamin (MULTI-VITAMIN DAILY PO) Take by mouth daily.    . Omega-3 1000 MG CAPS Take 1 capsule by mouth daily.    Marland Kitchen  triamcinolone (KENALOG) 0.025 % cream      No current facility-administered medications for this visit.     Family History  Problem Relation Age of Onset  . Ovarian cancer Mother 60       ovarian cancer  . Lung cancer Father 35       Lung cancer  . Osteoporosis Maternal Grandmother   . Colon cancer Maternal Grandfather        possibly dx under 56  . Esophageal cancer Maternal Grandfather 38  . Parkinsonism Paternal Grandmother   . Melanoma Sister 88       back   . Colon polyps Maternal Aunt   . Cervical cancer Maternal Aunt   . Colon cancer Maternal Uncle 59  . Prostate cancer Maternal Uncle 56  Mom had ovarian cancer, diagnosed at 110-44, died at 12 (in 71) MGF with colon cancer ? In his 30's, died in his late 98's of esophageal cancer.  Mom had 5 siblings, MU just diagnosed with stage 3 colon cancer, 85. The patient has had negative genetic testing.   Review of Systems  Constitutional: Negative.   HENT: Negative.   Eyes: Negative.   Respiratory: Negative.   Cardiovascular: Negative.   Gastrointestinal: Negative.   Endocrine: Negative.   Genitourinary: Negative.   Musculoskeletal: Negative.   Skin: Negative.  Allergic/Immunologic: Negative.   Neurological: Negative.   Hematological: Negative.   Psychiatric/Behavioral: Negative.     Exam:   BP 114/64 (BP Location: Right Arm, Patient Position: Sitting, Cuff Size: Normal)   Pulse 64   Temp 97.6 F (36.4 C) (Temporal)   Resp 14   Ht 5' 3.75" (1.619 m)   Wt 148 lb (67.1 kg)   LMP 07/03/2019   BMI 25.60 kg/m   Weight change: @WEIGHTCHANGE @ Height:   Height: 5' 3.75" (161.9 cm)  Ht Readings from Last 3 Encounters:  07/21/19 5' 3.75" (1.619 m)  07/28/18 5' 3.75" (1.619 m)  07/19/18 5' 3.75" (1.619 m)    General appearance: alert, cooperative and appears stated age Head: Normocephalic, without obvious abnormality, atraumatic Neck: no adenopathy, supple, symmetrical, trachea midline and thyroid normal to  inspection and palpation Lungs: clear to auscultation bilaterally Cardiovascular: regular rate and rhythm Breasts: normal appearance, no masses or tenderness Abdomen: soft, non-tender; non distended,  no masses,  no organomegaly Extremities: extremities normal, atraumatic, no cyanosis or edema Skin: Skin color, texture, turgor normal. No rashes or lesions Lymph nodes: Cervical, supraclavicular, and axillary nodes normal. No abnormal inguinal nodes palpated Neurologic: Grossly normal   Pelvic: External genitalia:  no lesions              Urethra:  normal appearing urethra with no masses, tenderness or lesions              Bartholins and Skenes: normal                 Vagina: normal appearing vagina with normal color and discharge, no lesions              Cervix: no lesions               Bimanual Exam:  Uterus:  normal size, contour, position, consistency, mobility, non-tender              Adnexa: no mass, fullness, tenderness               Rectovaginal: Confirms               Anus:  normal sphincter tone, no lesions  Chaperone was present for exam.  A:  Well Woman with normal exam  FH ovarian cancer, colon cancer, meleanoma. She had negative genetic testing.   P:   No pap this year  Screening labs (had coffee with creamer)  Mammogram due  Discussed breast self exam  Discussed calcium and vit D intake  We discussed the option of CA 125 and pelvic ultrasound to screen for ovarian cancer. Discussed that there is no great screen for ovarian cancer. Discussed possible false + results  She would like to do the CA 125 and pelvic ultrasound this year.

## 2019-07-19 ENCOUNTER — Other Ambulatory Visit: Payer: Self-pay

## 2019-07-20 DIAGNOSIS — D225 Melanocytic nevi of trunk: Secondary | ICD-10-CM | POA: Diagnosis not present

## 2019-07-20 DIAGNOSIS — L720 Epidermal cyst: Secondary | ICD-10-CM | POA: Diagnosis not present

## 2019-07-20 DIAGNOSIS — L821 Other seborrheic keratosis: Secondary | ICD-10-CM | POA: Diagnosis not present

## 2019-07-20 DIAGNOSIS — D1801 Hemangioma of skin and subcutaneous tissue: Secondary | ICD-10-CM | POA: Diagnosis not present

## 2019-07-20 DIAGNOSIS — L570 Actinic keratosis: Secondary | ICD-10-CM | POA: Diagnosis not present

## 2019-07-21 ENCOUNTER — Other Ambulatory Visit: Payer: Self-pay

## 2019-07-21 ENCOUNTER — Encounter: Payer: Self-pay | Admitting: Obstetrics and Gynecology

## 2019-07-21 ENCOUNTER — Ambulatory Visit: Payer: BC Managed Care – PPO | Admitting: Obstetrics and Gynecology

## 2019-07-21 VITALS — BP 114/64 | HR 64 | Temp 97.6°F | Resp 14 | Ht 63.75 in | Wt 148.0 lb

## 2019-07-21 DIAGNOSIS — Z0000 Encounter for general adult medical examination without abnormal findings: Secondary | ICD-10-CM

## 2019-07-21 DIAGNOSIS — Z800 Family history of malignant neoplasm of digestive organs: Secondary | ICD-10-CM | POA: Diagnosis not present

## 2019-07-21 DIAGNOSIS — Z8041 Family history of malignant neoplasm of ovary: Secondary | ICD-10-CM | POA: Diagnosis not present

## 2019-07-21 DIAGNOSIS — Z808 Family history of malignant neoplasm of other organs or systems: Secondary | ICD-10-CM

## 2019-07-21 DIAGNOSIS — Z01419 Encounter for gynecological examination (general) (routine) without abnormal findings: Secondary | ICD-10-CM | POA: Diagnosis not present

## 2019-07-21 DIAGNOSIS — L720 Epidermal cyst: Secondary | ICD-10-CM | POA: Diagnosis not present

## 2019-07-21 NOTE — Patient Instructions (Signed)

## 2019-07-22 LAB — CBC
Hematocrit: 40.8 % (ref 34.0–46.6)
Hemoglobin: 13.7 g/dL (ref 11.1–15.9)
MCH: 30.9 pg (ref 26.6–33.0)
MCHC: 33.6 g/dL (ref 31.5–35.7)
MCV: 92 fL (ref 79–97)
Platelets: 309 10*3/uL (ref 150–450)
RBC: 4.43 x10E6/uL (ref 3.77–5.28)
RDW: 12.9 % (ref 11.7–15.4)
WBC: 6.1 10*3/uL (ref 3.4–10.8)

## 2019-07-22 LAB — COMPREHENSIVE METABOLIC PANEL
ALT: 20 IU/L (ref 0–32)
AST: 20 IU/L (ref 0–40)
Albumin/Globulin Ratio: 2.4 — ABNORMAL HIGH (ref 1.2–2.2)
Albumin: 4.8 g/dL (ref 3.8–4.8)
Alkaline Phosphatase: 51 IU/L (ref 39–117)
BUN/Creatinine Ratio: 12 (ref 9–23)
BUN: 10 mg/dL (ref 6–24)
Bilirubin Total: 0.5 mg/dL (ref 0.0–1.2)
CO2: 25 mmol/L (ref 20–29)
Calcium: 9.5 mg/dL (ref 8.7–10.2)
Chloride: 103 mmol/L (ref 96–106)
Creatinine, Ser: 0.85 mg/dL (ref 0.57–1.00)
GFR calc Af Amer: 98 mL/min/{1.73_m2} (ref >59)
GFR calc non Af Amer: 85 mL/min/{1.73_m2} (ref >59)
Globulin, Total: 2 g/dL (ref 1.5–4.5)
Glucose: 80 mg/dL (ref 65–99)
Potassium: 4.7 mmol/L (ref 3.5–5.2)
Sodium: 140 mmol/L (ref 134–144)
Total Protein: 6.8 g/dL (ref 6.0–8.5)

## 2019-07-22 LAB — LIPID PANEL
Chol/HDL Ratio: 2.8 ratio (ref 0.0–4.4)
Cholesterol, Total: 237 mg/dL — ABNORMAL HIGH (ref 100–199)
HDL: 85 mg/dL (ref >39)
LDL Chol Calc (NIH): 145 mg/dL — ABNORMAL HIGH (ref 0–99)
Triglycerides: 43 mg/dL (ref 0–149)
VLDL Cholesterol Cal: 7 mg/dL (ref 5–40)

## 2019-07-22 LAB — CA 125: Cancer Antigen (CA) 125: 15.8 U/mL (ref 0.0–38.1)

## 2019-07-25 ENCOUNTER — Telehealth: Payer: Self-pay | Admitting: Obstetrics and Gynecology

## 2019-07-25 NOTE — Telephone Encounter (Signed)
Call placed to review benefit and schedule recommended ultrasound. Left voicemail message requesting a return call °

## 2019-07-26 NOTE — Telephone Encounter (Signed)
Patient has returned my call and had left a voicemail message. Returned call to patient, to review benefit and scheduled recommended ultrasound appointment. Left a voicemail message requesting a return call at my direct number [phone]. Advised if not available, to contact office manager, Seth Bake at [phone].   cc: Thayer Ohm

## 2019-07-27 NOTE — Telephone Encounter (Signed)
Pamela Villegas spoke with patient on 07/26/2019. Patient is scheduled 08/02/2019 with Dr Talbert Nan. Will close encounter

## 2019-07-29 ENCOUNTER — Other Ambulatory Visit: Payer: Self-pay

## 2019-08-01 DIAGNOSIS — L720 Epidermal cyst: Secondary | ICD-10-CM | POA: Diagnosis not present

## 2019-08-02 ENCOUNTER — Encounter: Payer: Self-pay | Admitting: Obstetrics and Gynecology

## 2019-08-02 ENCOUNTER — Other Ambulatory Visit: Payer: Self-pay

## 2019-08-02 ENCOUNTER — Ambulatory Visit (INDEPENDENT_AMBULATORY_CARE_PROVIDER_SITE_OTHER): Payer: BC Managed Care – PPO

## 2019-08-02 ENCOUNTER — Ambulatory Visit (INDEPENDENT_AMBULATORY_CARE_PROVIDER_SITE_OTHER): Payer: BC Managed Care – PPO | Admitting: Obstetrics and Gynecology

## 2019-08-02 VITALS — BP 104/62 | HR 60 | Temp 97.2°F | Wt 152.2 lb

## 2019-08-02 DIAGNOSIS — Z8041 Family history of malignant neoplasm of ovary: Secondary | ICD-10-CM

## 2019-08-02 NOTE — Progress Notes (Signed)
GYNECOLOGY  VISIT   HPI: 43 y.o.   Divorced White or Caucasian Not Hispanic or Latino  female   G0P0 with Patient's last menstrual period was 07/03/2019.   here for pelvic ultrasound, she has a family history of ovarian cancer and wants screening. Negative genetic testing. Recent CA 125 was normal.   GYNECOLOGIC HISTORY: Patient's last menstrual period was 07/03/2019. Contraception:Abstinence Menopausal hormone therapy: None        OB History    Gravida  0   Para  0   Term      Preterm      AB      Living        SAB      TAB      Ectopic      Multiple      Live Births                 Patient Active Problem List   Diagnosis Date Noted  . Right shoulder pain 05/17/2018  . Right carpal tunnel syndrome 05/17/2018  . Genetic testing 10/30/2017  . Family history of melanoma   . Family history of prostate cancer   . Family history of ovarian cancer 05/26/2017  . Family history of colon cancer 05/26/2017  . Chronic female pelvic pain 04/10/2017  . Cyst of right ovary 04/10/2017  . Pain of right hip joint 04/10/2017    Past Medical History:  Diagnosis Date  . Anxiety   . Chicken pox   . Depression   . Family history of colon cancer   . Family history of melanoma   . Family history of ovarian cancer   . Family history of prostate cancer   . Hyperlipidemia   . Urinary tract infection     Past Surgical History:  Procedure Laterality Date  . ANKLE SURGERY Right 2000  . BREAST BIOPSY  2002  . WISDOM TOOTH EXTRACTION  1998    Current Outpatient Medications  Medication Sig Dispense Refill  . b complex vitamins capsule Take 1 capsule by mouth daily.    . Coenzyme Q10-Fish Oil-Vit E (CO-Q 10 OMEGA-3 FISH OIL PO) Take by mouth.    . fluorouracil (EFUDEX) 5 % cream     . Multiple Vitamin (MULTI-VITAMIN DAILY PO) Take by mouth daily.    . Omega-3 1000 MG CAPS Take 1 capsule by mouth daily.    Marland Kitchen triamcinolone (KENALOG) 0.025 % cream      No current  facility-administered medications for this visit.      ALLERGIES: Patient has no known allergies.  Family History  Problem Relation Age of Onset  . Ovarian cancer Mother 98       ovarian cancer  . Lung cancer Father 6       Lung cancer  . Osteoporosis Maternal Grandmother   . Colon cancer Maternal Grandfather        possibly dx under 50  . Esophageal cancer Maternal Grandfather 70  . Parkinsonism Paternal Grandmother   . Melanoma Sister 36       back   . Colon polyps Maternal Aunt   . Cervical cancer Maternal Aunt   . Colon cancer Maternal Uncle 65  . Prostate cancer Maternal Uncle 54    Social History   Socioeconomic History  . Marital status: Divorced    Spouse name: Not on file  . Number of children: 0  . Years of education: Not on file  . Highest education level: Not on file  Occupational History  . Not on file  Social Needs  . Financial resource strain: Not on file  . Food insecurity    Worry: Not on file    Inability: Not on file  . Transportation needs    Medical: Not on file    Non-medical: Not on file  Tobacco Use  . Smoking status: Never Smoker  . Smokeless tobacco: Never Used  Substance and Sexual Activity  . Alcohol use: Not Currently  . Drug use: No  . Sexual activity: Not Currently    Partners: Female, Female    Birth control/protection: Abstinence  Lifestyle  . Physical activity    Days per week: Not on file    Minutes per session: Not on file  . Stress: Not on file  Relationships  . Social Herbalist on phone: Not on file    Gets together: Not on file    Attends religious service: Not on file    Active member of club or organization: Not on file    Attends meetings of clubs or organizations: Not on file    Relationship status: Not on file  . Intimate partner violence    Fear of current or ex partner: Not on file    Emotionally abused: Not on file    Physically abused: Not on file    Forced sexual activity: Not on file   Other Topics Concern  . Not on file  Social History Narrative  . Not on file    Review of Systems  Constitutional: Negative.   HENT: Negative.   Eyes: Negative.   Respiratory: Negative.   Cardiovascular: Negative.   Gastrointestinal: Negative.   Genitourinary: Negative.   Musculoskeletal: Negative.   Skin: Negative.   Neurological: Negative.   Endo/Heme/Allergies: Negative.   Psychiatric/Behavioral: Negative.     PHYSICAL EXAMINATION:    BP 104/62 (BP Location: Right Arm, Patient Position: Sitting, Cuff Size: Normal)   Pulse 60   Temp (!) 97.2 F (36.2 C) (Skin)   Wt 152 lb 3.2 oz (69 kg)   LMP 07/03/2019   BMI 26.33 kg/m     General appearance: alert, cooperative and appears stated age  Ultrasound images reviewed with the patient. Small hemorrhagic CL in the right ovary (no flow), normal ultrasound.   ASSESSMENT FH of ovarian cancer, negative genetic testing    PLAN Normal ultrasound and CA 125.    An After Visit Summary was printed and given to the patient.

## 2019-08-10 ENCOUNTER — Other Ambulatory Visit (HOSPITAL_BASED_OUTPATIENT_CLINIC_OR_DEPARTMENT_OTHER): Payer: Self-pay | Admitting: Family Medicine

## 2019-08-10 DIAGNOSIS — Z1231 Encounter for screening mammogram for malignant neoplasm of breast: Secondary | ICD-10-CM

## 2019-08-12 ENCOUNTER — Other Ambulatory Visit: Payer: Self-pay

## 2019-08-12 ENCOUNTER — Ambulatory Visit (HOSPITAL_BASED_OUTPATIENT_CLINIC_OR_DEPARTMENT_OTHER)
Admission: RE | Admit: 2019-08-12 | Discharge: 2019-08-12 | Disposition: A | Discharge status: Home / Self Care | Payer: BC Managed Care – PPO | Source: Ambulatory Visit | Attending: Family Medicine | Admitting: Family Medicine

## 2019-08-12 DIAGNOSIS — Z1231 Encounter for screening mammogram for malignant neoplasm of breast: Secondary | ICD-10-CM | POA: Diagnosis not present

## 2019-08-23 DIAGNOSIS — D485 Neoplasm of uncertain behavior of skin: Secondary | ICD-10-CM | POA: Diagnosis not present

## 2019-08-23 DIAGNOSIS — L723 Sebaceous cyst: Secondary | ICD-10-CM | POA: Diagnosis not present

## 2019-09-15 IMAGING — MG DIGITAL SCREENING BILATERAL MAMMOGRAM WITH TOMO AND CAD
6 of 10 series · 6 of 30 positions shown · non-contrast
Comparison: None.

CLINICAL DATA: Screening.

EXAM:
DIGITAL SCREENING BILATERAL MAMMOGRAM WITH TOMO AND CAD

[R MLO synth-2D]
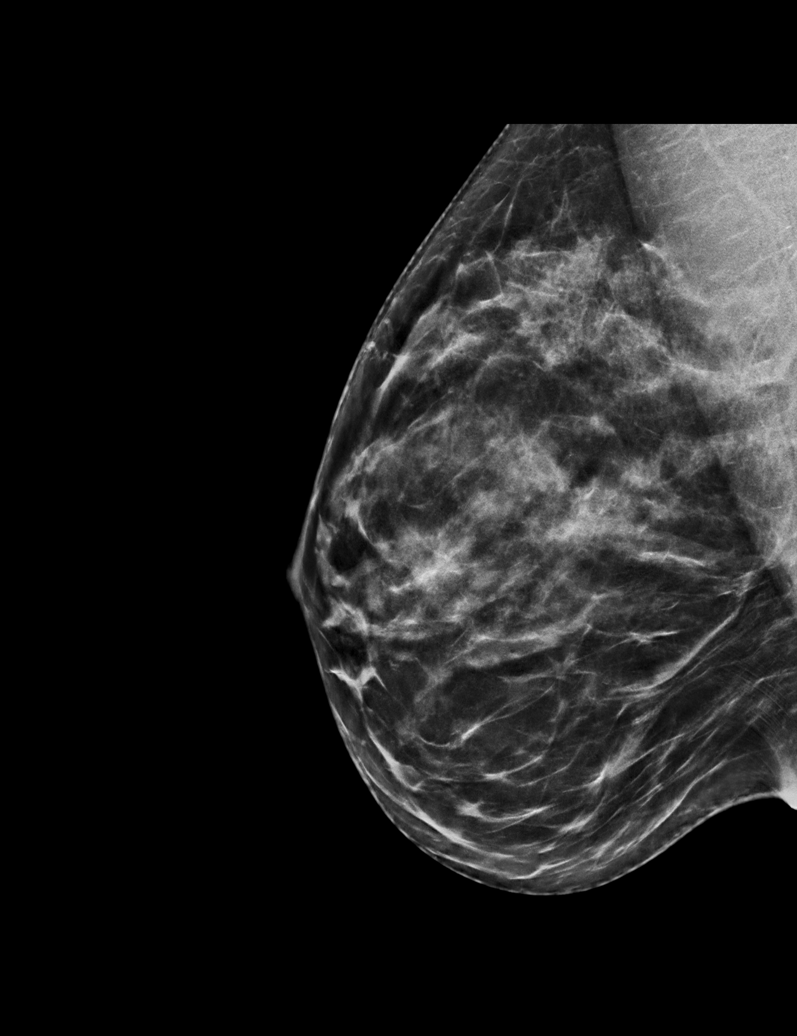

[L CC synth-2D]
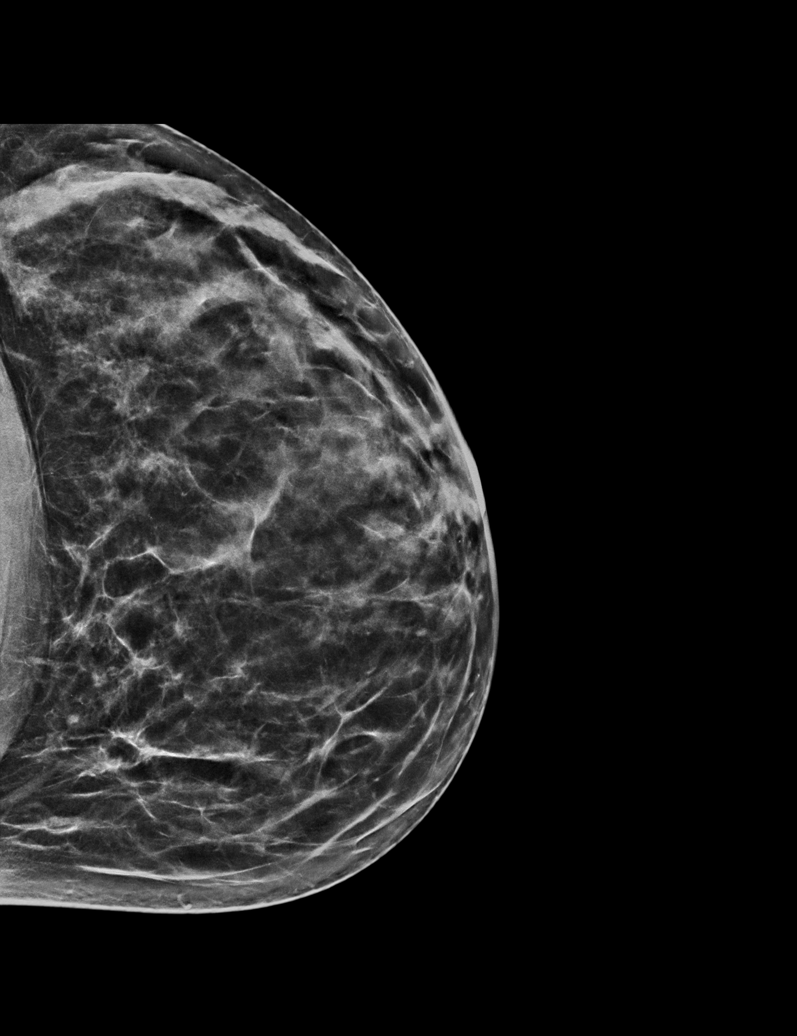

[R CC synth-2D]
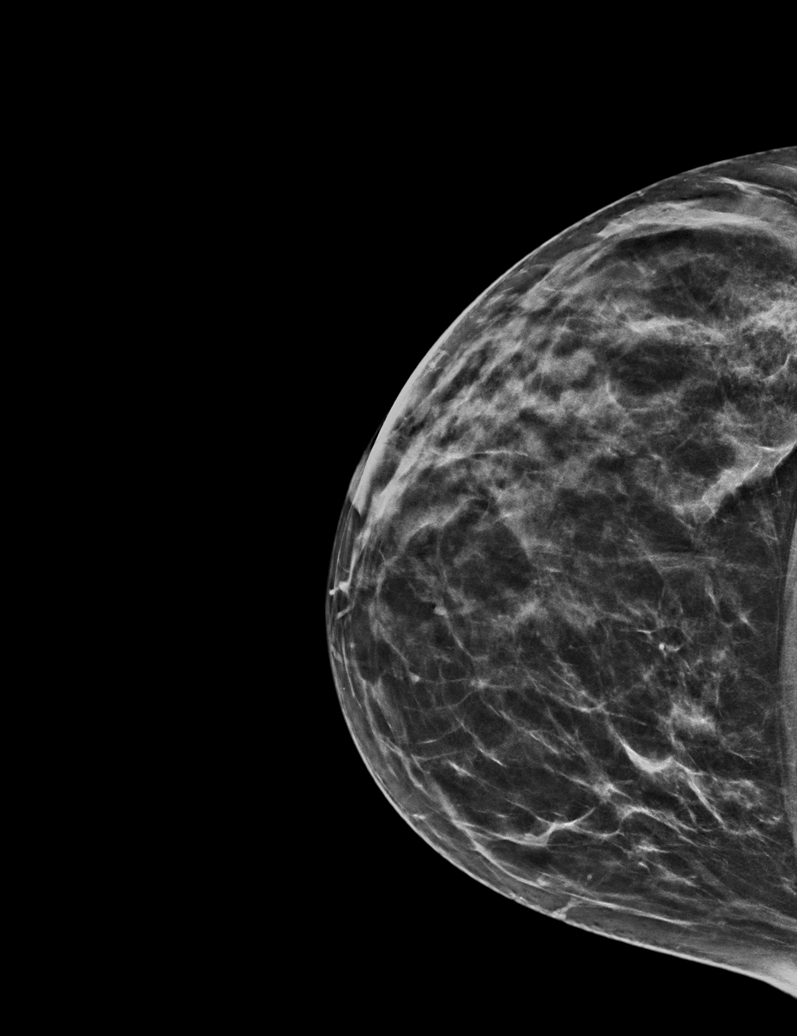

[R XCCL synth-2D]
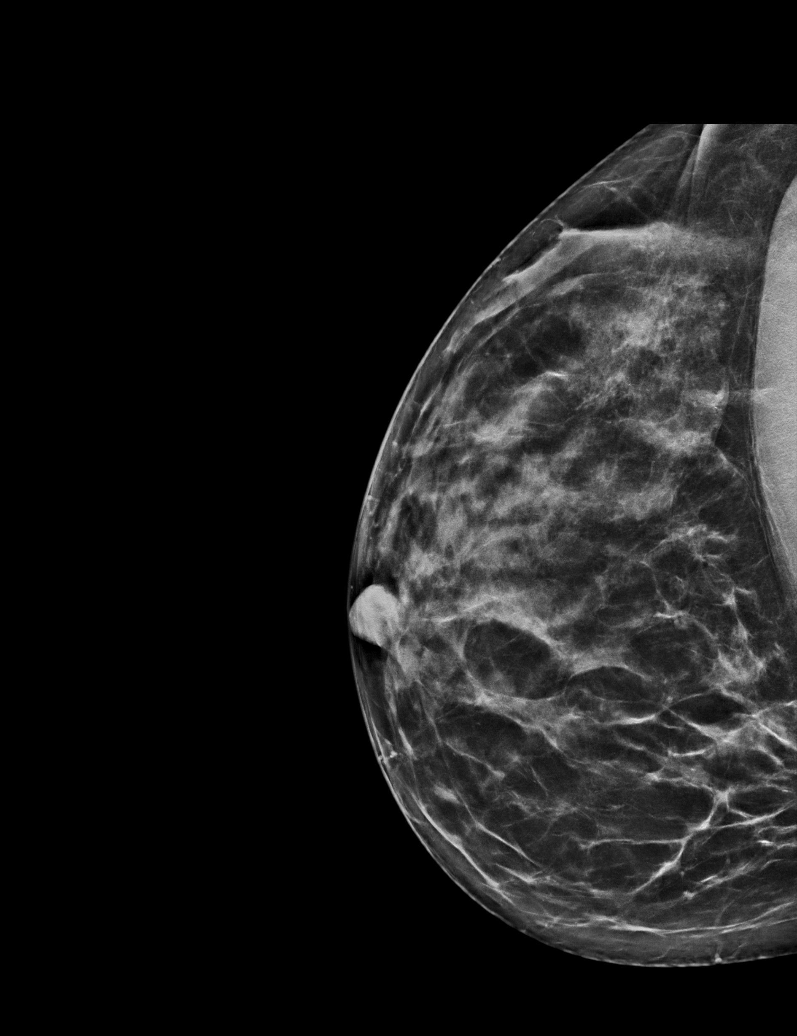

[L MLO synth-2D]
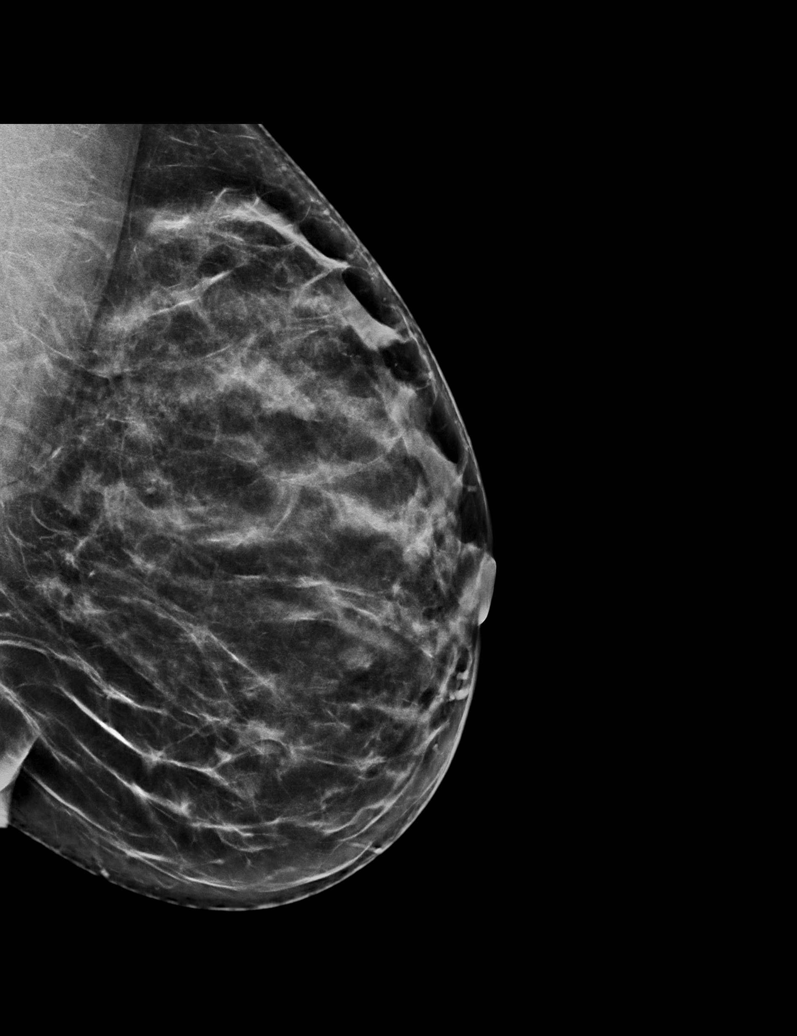

[L MLO tomo · tomo slice 35/69.0]
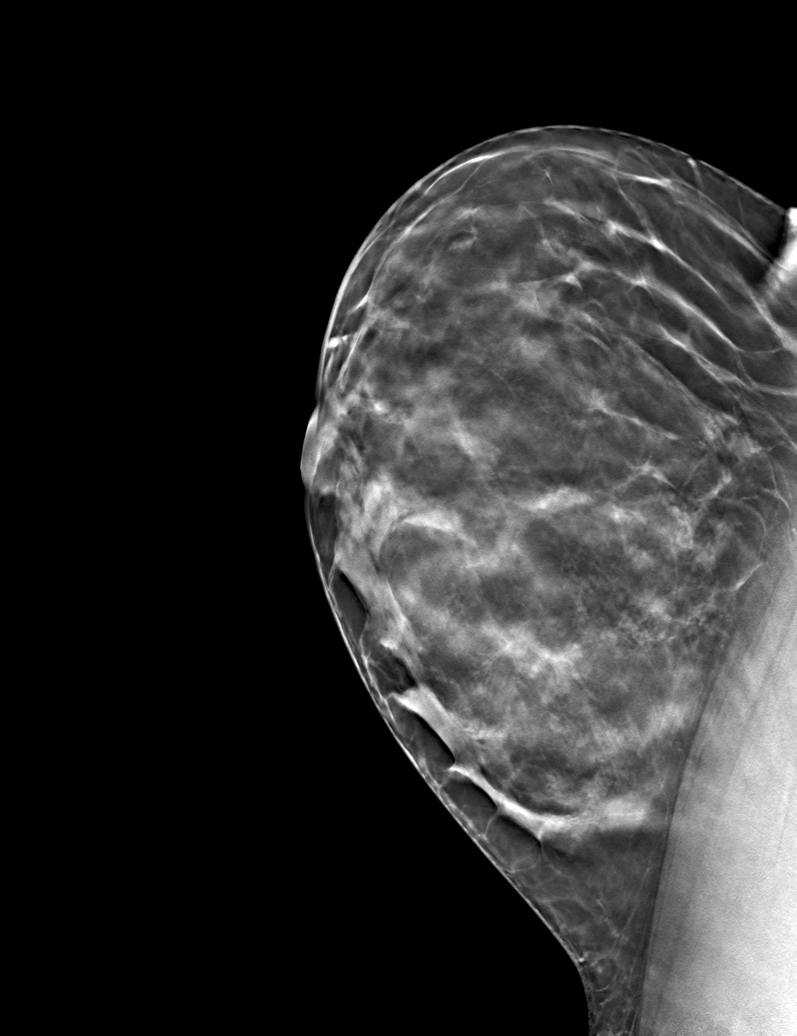

[6 of 30 positions shown; findings below may reference images not displayed]

ACR Breast Density Category c: The breast tissue is heterogeneously
dense, which may obscure small masses
FINDINGS: There are no findings suspicious for malignancy. Images were
processed with CAD.
IMPRESSION: No mammographic evidence of malignancy. A result letter of this
screening mammogram will be mailed directly to the patient.

RECOMMENDATION:
Screening mammogram in one year. (Code:EM-2-IHY)

BI-RADS CATEGORY  1: Negative.

## 2019-11-25 DIAGNOSIS — Z20828 Contact with and (suspected) exposure to other viral communicable diseases: Secondary | ICD-10-CM | POA: Diagnosis not present

## 2019-11-29 NOTE — Progress Notes (Signed)
[facility] [address], [address] [place], [place] [address] [phone]  Date:  12/01/2019   Name:  Pamela Villegas   DOB:  [birth year]   MRN:  [id number]  PCP:  Pearline Cables, MD    Chief Complaint: No chief complaint on file.   History of Present Illness:  Pamela Villegas is a 44 y.o. very pleasant female patient who presents with the following:  Here today for a virtual visit following covid 19 infection Patient location is home, provider location is office Patient identity confirmed with 2 factors, she gives consent for virtual visit today.  The patient and myself are present on the call today  Generally in good health  Last seen by myself in 2019  She sees her gynecologist, Dr. Oscar La on a regular basis   Pt notes that she got sick with a ST 8 days ago She went to be tested for covid 19 2 days later and was negative She noted a mild fever to 98.9 max Cough, nausea At this time she feels better except she is still stuffy with sinus pressure Possible sinus infection She is using dayquil/nyquil severe No vomiting or diarrhea Her throat is now better  No known exposure to covid 19  She works rotating shifts- she is supposed to return to work on Saturday She drives a tanker truck for [employer]  No chance of pregnancy at this time    Patient Active Problem List   Diagnosis Date Noted  . Right shoulder pain 05/17/2018  . Right carpal tunnel syndrome 05/17/2018  . Genetic testing 10/30/2017  . Family history of melanoma   . Family history of prostate cancer   . Family history of ovarian cancer 05/26/2017  . Family history of colon cancer 05/26/2017  . Chronic female pelvic pain 04/10/2017  . Cyst of right ovary 04/10/2017  . Pain of right hip joint 04/10/2017    Past Medical History:  Diagnosis Date  . Anxiety   . Chicken pox   . Depression   . Family history of colon cancer   . Family history  of melanoma   . Family history of ovarian cancer   . Family history of prostate cancer   . Hyperlipidemia   . Urinary tract infection     Past Surgical History:  Procedure Laterality Date  . ANKLE SURGERY Right 2000  . BREAST BIOPSY  2002  . WISDOM TOOTH EXTRACTION  1998    Social History   Tobacco Use  . Smoking status: Never Smoker  . Smokeless tobacco: Never Used  Substance Use Topics  . Alcohol use: Not Currently  . Drug use: No    Family History  Problem Relation Age of Onset  . Ovarian cancer Mother 61       ovarian cancer  . Lung cancer Father 36       Lung cancer  . Osteoporosis Maternal Grandmother   . Colon cancer Maternal Grandfather        possibly dx under 50  . Esophageal cancer Maternal Grandfather 45  . Parkinsonism Paternal Grandmother   . Melanoma Sister 36       back   . Colon polyps Maternal Aunt   . Cervical cancer Maternal Aunt   . Colon cancer Maternal Uncle 65  . Prostate cancer Maternal Uncle 62    No Known Allergies  Medication list has been reviewed and updated.  Current Outpatient Medications  on File Prior to Visit  Medication Sig Dispense Refill  . b complex vitamins capsule Take 1 capsule by mouth daily.    . Coenzyme Q10-Fish Oil-Vit E (CO-Q 10 OMEGA-3 FISH OIL PO) Take by mouth.    . fluorouracil (EFUDEX) 5 % cream     . Multiple Vitamin (MULTI-VITAMIN DAILY PO) Take by mouth daily.    . Omega-3 1000 MG CAPS Take 1 capsule by mouth daily.    Marland Kitchen triamcinolone (KENALOG) 0.025 % cream      No current facility-administered medications on file prior to visit.    Review of Systems:  As per HPI- otherwise negative.   Physical Examination: There were no vitals filed for this visit. There were no vitals filed for this visit. There is no height or weight on file to calculate BMI. Ideal Body Weight:    Video call today- pt looks well  Not checking vitals No cough wheezing or SOB is noted, no distress  Assessment and  Plan: Acute non-recurrent frontal sinusitis - Plan: amoxicillin (AMOXIL) 500 MG capsule, fluconazole (DIFLUCAN) 150 MG tablet  Low grade fever  Recent illness, negative covid 19 testing.  Ok to RTW this Saturday- continue masking and social distance rx for amoxicillin and diflucan to use in case of monilia She will let me know if not feeling better in the next few days- Sooner if worse.  Letter for her to RTW Moderate med decision making today   Signed Lamar Blinks, MD

## 2019-12-01 ENCOUNTER — Ambulatory Visit (INDEPENDENT_AMBULATORY_CARE_PROVIDER_SITE_OTHER): Payer: BC Managed Care – PPO | Admitting: Family Medicine

## 2019-12-01 ENCOUNTER — Other Ambulatory Visit: Payer: Self-pay

## 2019-12-01 ENCOUNTER — Encounter: Payer: Self-pay | Admitting: Family Medicine

## 2019-12-01 DIAGNOSIS — J0110 Acute frontal sinusitis, unspecified: Secondary | ICD-10-CM | POA: Diagnosis not present

## 2019-12-01 DIAGNOSIS — R509 Fever, unspecified: Secondary | ICD-10-CM

## 2019-12-01 MED ORDER — AMOXICILLIN 500 MG PO CAPS: 1000.0000 mg | ORAL_CAPSULE | Freq: Two times a day (BID) | ORAL | 0 refills | Status: DC

## 2019-12-01 MED ORDER — FLUCONAZOLE 150 MG PO TABS: 150.0000 mg | ORAL_TABLET | Freq: Once | ORAL | 0 refills | Status: AC

## 2020-07-23 ENCOUNTER — Encounter: Payer: Self-pay | Admitting: Obstetrics and Gynecology

## 2020-07-23 ENCOUNTER — Ambulatory Visit (INDEPENDENT_AMBULATORY_CARE_PROVIDER_SITE_OTHER): Payer: BC Managed Care – PPO | Admitting: Obstetrics and Gynecology

## 2020-07-23 ENCOUNTER — Other Ambulatory Visit: Payer: Self-pay

## 2020-07-23 VITALS — BP 118/72 | HR 58 | Ht 63.75 in | Wt 143.0 lb

## 2020-07-23 DIAGNOSIS — Z800 Family history of malignant neoplasm of digestive organs: Secondary | ICD-10-CM

## 2020-07-23 DIAGNOSIS — Z8041 Family history of malignant neoplasm of ovary: Secondary | ICD-10-CM

## 2020-07-23 DIAGNOSIS — Z0000 Encounter for general adult medical examination without abnormal findings: Secondary | ICD-10-CM

## 2020-07-23 DIAGNOSIS — Z808 Family history of malignant neoplasm of other organs or systems: Secondary | ICD-10-CM

## 2020-07-23 DIAGNOSIS — Z01419 Encounter for gynecological examination (general) (routine) without abnormal findings: Secondary | ICD-10-CM

## 2020-07-23 NOTE — Patient Instructions (Addendum)
EXERCISE AND DIET:  We recommended that you start or continue a regular exercise program for good health. Regular exercise means any activity that makes your heart beat faster and makes you sweat.  We recommend exercising at least 30 minutes per day at least 3 days a week, preferably 4 or 5.  We also recommend a diet low in fat and sugar.  Inactivity, poor dietary choices and obesity can cause diabetes, heart attack, stroke, and kidney damage, among others.    ALCOHOL AND SMOKING:  Women should limit their alcohol intake to no more than 7 drinks/beers/glasses of wine (combined, not each!) per week. Moderation of alcohol intake to this level decreases your risk of breast cancer and liver damage. And of course, no recreational drugs are part of a healthy lifestyle.  And absolutely no smoking or even second hand smoke. Most people know smoking can cause heart and lung diseases, but did you know it also contributes to weakening of your bones? Aging of your skin?  Yellowing of your teeth and nails?  CALCIUM AND VITAMIN D:  Adequate intake of calcium and Vitamin D are recommended.  The recommendations for exact amounts of these supplements seem to change often, but generally speaking 1,000 mg of calcium (between diet and supplement) and 800 units of Vitamin D per day seems prudent. Certain women may benefit from higher intake of Vitamin D.  If you are among these women, your doctor will have told you during your visit.    PAP SMEARS:  Pap smears, to check for cervical cancer or precancers,  have traditionally been done yearly, although recent scientific advances have shown that most women can have pap smears less often.  However, every woman still should have a physical exam from her gynecologist every 42 years. It will include a breast check, inspection of the vulva and vagina to check for abnormal growths or skin changes, a visual exam of the cervix, and then an exam to evaluate the size and shape of the uterus and  ovaries.  And after 42 years of age, a rectal exam is indicated to check for rectal cancers. We will also provide age appropriate advice regarding health maintenance, like when you should have certain vaccines, screening for sexually transmitted diseases, bone density testing, colonoscopy, mammograms, etc.   MAMMOGRAMS:  All women over 42 years old should have a yearly mammogram. Many facilities now offer a "3D" mammogram, which may cost around $50 extra out of pocket. If possible,  we recommend you accept the option to have the 3D mammogram performed.  It both reduces the number of women who will be called back for extra views which then turn out to be normal, and it is better than the routine mammogram at detecting truly abnormal areas.    COLON CANCER SCREENING: Now recommend starting at age 57. At this time colonoscopy is not covered for routine screening until 50. There are take home tests that can be done between 42-49.   COLONOSCOPY:  Colonoscopy to screen for colon cancer is recommended for all women at age 42.  We know, you hate the idea of the prep.  We agree, BUT, having colon cancer and not knowing it is worse!!  Colon cancer so often starts as a polyp that can be seen and removed at colonscopy, which can quite literally save your life!  And if your first colonoscopy is normal and you have no family history of colon cancer, most women don't have to have it again for  42 years.  Once every ten years, you can do something that may end up saving your life, right?  We will be happy to help you get it scheduled when you are ready.  Be sure to check your insurance coverage so you understand how much it will cost.  It may be covered as a preventative service at no cost, but you should check your particular policy.   ° ° ° °Breast Self-Awareness °Breast self-awareness means being familiar with how your breasts look and feel. It involves checking your breasts regularly and reporting any changes to your  health care provider. °Practicing breast self-awareness is important. A change in your breasts can be a sign of a serious medical problem. Being familiar with how your breasts look and feel allows you to find any problems early, when treatment is more likely to be successful. All women should practice breast self-awareness, including women who have had breast implants. °How to do a breast self-exam °One way to learn what is normal for your breasts and whether your breasts are changing is to do a breast self-exam. To do a breast self-exam: °Look for Changes ° °1. Remove all the clothing above your waist. °2. Stand in front of a mirror in a room with good lighting. °3. Put your hands on your hips. °4. Push your hands firmly downward. °5. Compare your breasts in the mirror. Look for differences between them (asymmetry), such as: °? Differences in shape. °? Differences in size. °? Puckers, dips, and bumps in one breast and not the other. °6. Look at each breast for changes in your skin, such as: °? Redness. °? Scaly areas. °7. Look for changes in your nipples, such as: °? Discharge. °? Bleeding. °? Dimpling. °? Redness. °? A change in position. °Feel for Changes °Carefully feel your breasts for lumps and changes. It is best to do this while lying on your back on the floor and again while sitting or standing in the shower or tub with soapy water on your skin. Feel each breast in the following way: °· Place the arm on the side of the breast you are examining above your head. °· Feel your breast with the other hand. °· Start in the nipple area and make ¾ inch (2 cm) overlapping circles to feel your breast. Use the pads of your three middle fingers to do this. Apply light pressure, then medium pressure, then firm pressure. The light pressure will allow you to feel the tissue closest to the skin. The medium pressure will allow you to feel the tissue that is a little deeper. The firm pressure will allow you to feel the tissue  close to the ribs. °· Continue the overlapping circles, moving downward over the breast until you feel your ribs below your breast. °· Move one finger-width toward the center of the body. Continue to use the ¾ inch (2 cm) overlapping circles to feel your breast as you move slowly up toward your collarbone. °· Continue the up and down exam using all three pressures until you reach your armpit. ° °Write Down What You Find ° °Write down what is normal for each breast and any changes that you find. Keep a written record with breast changes or normal findings for each breast. By writing this information down, you do not need to depend only on memory for size, tenderness, or location. Write down where you are in your menstrual cycle, if you are still menstruating. °If you are having trouble noticing differences   in your breasts, do not get discouraged. With time you will become more familiar with the variations in your breasts and more comfortable with the exam. How often should I examine my breasts? Examine your breasts every month. If you are breastfeeding, the best time to examine your breasts is after a feeding or after using a breast pump. If you menstruate, the best time to examine your breasts is 5-7 days after your period is over. During your period, your breasts are lumpier, and it may be more difficult to notice changes. When should I see my health care provider? See your health care provider if you notice:  A change in shape or size of your breasts or nipples.  A change in the skin of your breast or nipples, such as a reddened or scaly area.  Unusual discharge from your nipples.  A lump or thick area that was not there before.  Pain in your breasts.  Anything that concerns you.   Plantar Fasciitis  Plantar fasciitis is a painful foot condition that affects the heel. It occurs when the band of tissue that connects the toes to the heel bone (plantar fascia) becomes irritated. This can happen  as the result of exercising too much or doing other repetitive activities (overuse injury). The pain from plantar fasciitis can range from mild irritation to severe pain that makes it difficult to walk or move. The pain is usually worse in the morning after sleeping, or after sitting or lying down for a while. Pain may also be worse after long periods of walking or standing. What are the causes? This condition may be caused by:  Standing for long periods of time.  Wearing shoes that do not have good arch support.  Doing activities that put stress on joints (high-impact activities), including running, aerobics, and ballet.  Being overweight.  An abnormal way of walking (gait).  Tight muscles in the back of your lower leg (calf).  High arches in your feet.  Starting a new athletic activity. What are the signs or symptoms? The main symptom of this condition is heel pain. Pain may:  Be worse with first steps after a time of rest, especially in the morning after sleeping or after you have been sitting or lying down for a while.  Be worse after long periods of standing still.  Decrease after 30-45 minutes of activity, such as gentle walking. How is this diagnosed? This condition may be diagnosed based on your medical history and your symptoms. Your health care provider may ask questions about your activity level. Your health care provider will do a physical exam to check for:  A tender area on the bottom of your foot.  A high arch in your foot.  Pain when you move your foot.  Difficulty moving your foot. You may have imaging tests to confirm the diagnosis, such as:  X-rays.  Ultrasound.  MRI. How is this treated? Treatment for plantar fasciitis depends on how severe your condition is. Treatment may include:  Rest, ice, applying pressure (compression), and raising the affected foot (elevation). This may be called RICE therapy. Your health care provider may recommend RICE  therapy along with over-the-counter pain medicines to manage your pain.  Exercises to stretch your calves and your plantar fascia.  A splint that holds your foot in a stretched, upward position while you sleep (night splint).  Physical therapy to relieve symptoms and prevent problems in the future.  Injections of steroid medicine (cortisone) to relieve pain  and inflammation.  Stimulating your plantar fascia with electrical impulses (extracorporeal shock wave therapy). This is usually the last treatment option before surgery.  Surgery, if other treatments have not worked after 12 months. Follow these instructions at home:  Managing pain, stiffness, and swelling  If directed, put ice on the painful area: ? Put ice in a plastic bag, or use a frozen bottle of water. ? Place a towel between your skin and the bag or bottle. ? Roll the bottom of your foot over the bag or bottle. ? Do this for 20 minutes, 2-3 times a day.  Wear athletic shoes that have air-sole or gel-sole cushions, or try wearing soft shoe inserts that are designed for plantar fasciitis.  Raise (elevate) your foot above the level of your heart while you are sitting or lying down. Activity  Avoid activities that cause pain. Ask your health care provider what activities are safe for you.  Do physical therapy exercises and stretches as told by your health care provider.  Try activities and forms of exercise that are easier on your joints (low-impact). Examples include swimming, water aerobics, and biking. General instructions  Take over-the-counter and prescription medicines only as told by your health care provider.  Wear a night splint while sleeping, if told by your health care provider. Loosen the splint if your toes tingle, become numb, or turn cold and blue.  Maintain a healthy weight, or work with your health care provider to lose weight as needed.  Keep all follow-up visits as told by your health care provider.  This is important. Contact a health care provider if you:  Have symptoms that do not go away after caring for yourself at home.  Have pain that gets worse.  Have pain that affects your ability to move or do your daily activities. Summary  Plantar fasciitis is a painful foot condition that affects the heel. It occurs when the band of tissue that connects the toes to the heel bone (plantar fascia) becomes irritated.  The main symptom of this condition is heel pain that may be worse after exercising too much or standing still for a long time.  Treatment varies, but it usually starts with rest, ice, compression, and elevation (RICE therapy) and over-the-counter medicines to manage pain. This information is not intended to replace advice given to you by your health care provider. Make sure you discuss any questions you have with your health care provider. Document Revised: 10/02/2017 Document Reviewed: 08/17/2017 Elsevier Patient Education  2020 [employer].

## 2020-07-23 NOTE — Progress Notes (Signed)
44 y.o. G0P0 Divorced White or Caucasian Not Hispanic or Latino female here for annual exam.  Not sexually active in the last year.  Period Cycle (Days): 28 Period Duration (Days): 3-7 Period Pattern: Regular Menstrual Flow: Light, Moderate, Heavy Menstrual Control: Tampon, Thin pad Menstrual Control Change Freq (Hours): 2 Dysmenorrhea: (!) Mild Dysmenorrhea Symptoms: Other (Comment) (back pain)   She has a FH of ovarian cancer, melanoma and colon cancer. She has had negative genetic testing. TC breast cancer risk is 10.7%.  Struggling with plantars fascitis.   Patient's last menstrual period was 07/09/2020 (within days).          Sexually active: No.  The current method of family planning is abstinence.    Exercising: No.  The patient does not participate in regular exercise at present. Smoker:  no  Health Maintenance: Pap:  07/19/18 Neg:Neg HR HPV History of abnormal Pap:  no MMG:  08/12/19 BIRADS 1 negative BMD:   n/a Colonoscopy: n/a TDaP:  2019 Gardasil: yes complete   reports that she has never smoked. She has never used smokeless tobacco. She reports previous alcohol use. She reports that she does not use drugs. Sober for almost 2 years. Working as Product manager  Past Medical History:  Diagnosis Date   Anxiety    Chicken pox    Depression    Family history of colon cancer    Family history of melanoma    Family history of ovarian cancer    Family history of prostate cancer    Hyperlipidemia    Urinary tract infection     Past Surgical History:  Procedure Laterality Date   ANKLE SURGERY Right 2000   BREAST BIOPSY  2002   WISDOM TOOTH EXTRACTION  1998    Current Outpatient Medications  Medication Sig Dispense Refill   b complex vitamins capsule Take 1 capsule by mouth daily.     Coenzyme Q10-Fish Oil-Vit E (CO-Q 10 OMEGA-3 FISH OIL PO) Take by mouth.     Multiple Vitamin (MULTI-VITAMIN DAILY PO) Take by mouth daily.     Omega-3 1000 MG  CAPS Take 1 capsule by mouth daily.     VITAMIN C-VITAMIN D-ZINC PO Take by mouth.     fluorouracil (EFUDEX) 5 % cream  (Patient not taking: Reported on 07/23/2020)     triamcinolone (KENALOG) 0.025 % cream  (Patient not taking: Reported on 07/23/2020)     No current facility-administered medications for this visit.    Family History  Problem Relation Age of Onset   Ovarian cancer Mother 65       ovarian cancer   Lung cancer Father 84       Lung cancer   Osteoporosis Maternal Grandmother    Colon cancer Maternal Grandfather        possibly dx under 50   Esophageal cancer Maternal Grandfather 16   Parkinsonism Paternal Grandmother    Melanoma Sister 15       back    Colon polyps Maternal Aunt    Cervical cancer Maternal Aunt    Colon cancer Maternal Uncle 65   Prostate cancer Maternal Uncle 62    Review of Systems  Constitutional: Negative.   HENT: Negative.   Eyes: Negative.   Respiratory: Negative.   Cardiovascular: Negative.   Gastrointestinal: Negative.   Endocrine: Negative.   Genitourinary: Negative.   Musculoskeletal: Negative.   Skin: Negative.   Allergic/Immunologic: Negative.   Neurological: Negative.   Hematological: Negative.   Psychiatric/Behavioral: Negative.  Exam:   BP 118/72 (BP Location: Right Arm, Patient Position: Sitting, Cuff Size: Normal)    Pulse (!) 58    Ht 5' 3.75" (1.619 m)    Wt 143 lb (64.9 kg)    LMP 07/09/2020 (Within Days)    SpO2 97%    BMI 24.74 kg/m   Weight change: @WEIGHTCHANGE @ Height:   Height: 5' 3.75" (161.9 cm)  Ht Readings from Last 3 Encounters:  07/23/20 5' 3.75" (1.619 m)  07/21/19 5' 3.75" (1.619 m)  07/28/18 5' 3.75" (1.619 m)    General appearance: alert, cooperative and appears stated age Head: Normocephalic, without obvious abnormality, atraumatic Neck: no adenopathy, supple, symmetrical, trachea midline and thyroid normal to inspection and palpation Lungs: clear to auscultation  bilaterally Cardiovascular: regular rate and rhythm Breasts: normal appearance, no masses or tenderness Abdomen: soft, non-tender; non distended,  no masses,  no organomegaly Extremities: extremities normal, atraumatic, no cyanosis or edema Skin: Skin color, texture, turgor normal. No rashes or lesions Lymph nodes: Cervical, supraclavicular, and axillary nodes normal. No abnormal inguinal nodes palpated Neurologic: Grossly normal   Pelvic: External genitalia:  no lesions              Urethra:  normal appearing urethra with no masses, tenderness or lesions              Bartholins and Skenes: normal                 Vagina: normal appearing vagina with normal color and discharge, no lesions              Cervix: no lesions               Bimanual Exam:  Uterus:  normal size, contour, position, consistency, mobility, non-tender              Adnexa: no mass, fullness, tenderness               Rectovaginal: Confirms               Anus:  normal sphincter tone, no lesions  07/30/18 chaperoned for the exam.  A:  Well Woman with normal exam  FH ovarian cancer, colon cancer and melanoma. She has had negative genetic testing. Declines pelvic ultrasounds and CA 125  P:   No pap this year  Mammogram next month  Discussed breast self exam  Discussed calcium and vit D intake  She needs to see a Dermatologist for a skin check  Screening labs

## 2020-07-24 LAB — COMPREHENSIVE METABOLIC PANEL
ALT: 27 IU/L (ref 0–32)
AST: 21 IU/L (ref 0–40)
Albumin/Globulin Ratio: 1.9 (ref 1.2–2.2)
Albumin: 4.5 g/dL (ref 3.8–4.8)
Alkaline Phosphatase: 61 IU/L (ref 44–121)
BUN/Creatinine Ratio: 12 (ref 9–23)
BUN: 10 mg/dL (ref 6–24)
Bilirubin Total: 0.5 mg/dL (ref 0.0–1.2)
CO2: 26 mmol/L (ref 20–29)
Calcium: 9.5 mg/dL (ref 8.7–10.2)
Chloride: 102 mmol/L (ref 96–106)
Creatinine, Ser: 0.81 mg/dL (ref 0.57–1.00)
GFR calc Af Amer: 104 mL/min/{1.73_m2} (ref >59)
GFR calc non Af Amer: 90 mL/min/{1.73_m2} (ref >59)
Globulin, Total: 2.4 g/dL (ref 1.5–4.5)
Glucose: 77 mg/dL (ref 65–99)
Potassium: 4 mmol/L (ref 3.5–5.2)
Sodium: 140 mmol/L (ref 134–144)
Total Protein: 6.9 g/dL (ref 6.0–8.5)

## 2020-07-24 LAB — LIPID PANEL
Chol/HDL Ratio: 3.5 ratio (ref 0.0–4.4)
Cholesterol, Total: 236 mg/dL — ABNORMAL HIGH (ref 100–199)
HDL: 68 mg/dL (ref >39)
LDL Chol Calc (NIH): 142 mg/dL — ABNORMAL HIGH (ref 0–99)
Triglycerides: 148 mg/dL (ref 0–149)
VLDL Cholesterol Cal: 26 mg/dL (ref 5–40)

## 2020-07-24 LAB — CBC
Hematocrit: 40.5 % (ref 34.0–46.6)
Hemoglobin: 14.2 g/dL (ref 11.1–15.9)
MCH: 33.1 pg — ABNORMAL HIGH (ref 26.6–33.0)
MCHC: 35.1 g/dL (ref 31.5–35.7)
MCV: 94 fL (ref 79–97)
Platelets: 253 10*3/uL (ref 150–450)
RBC: 4.29 x10E6/uL (ref 3.77–5.28)
RDW: 12.8 % (ref 11.7–15.4)
WBC: 7.3 10*3/uL (ref 3.4–10.8)

## 2020-07-25 ENCOUNTER — Ambulatory Visit: Payer: BC Managed Care – PPO | Admitting: Obstetrics and Gynecology

## 2020-09-04 ENCOUNTER — Telehealth: Payer: BC Managed Care – PPO | Admitting: Physician Assistant

## 2020-09-04 DIAGNOSIS — K649 Unspecified hemorrhoids: Secondary | ICD-10-CM | POA: Diagnosis not present

## 2020-09-04 MED ORDER — HYDROCORTISONE ACETATE 25 MG RE SUPP: 25.0000 mg | Freq: Two times a day (BID) | RECTAL | 0 refills | Status: DC

## 2020-09-04 NOTE — Progress Notes (Signed)
E-Visit for Hemorrhoid  We are sorry that you are not feeling well. We are here to help!  Hemorrhoids are swollen veins in the rectum. They can cause itching, bleeding, and pain. Hemorrhoids are very common.  In some cases, you can see or feel hemorrhoids around the outside of the rectum. In other cases, you cannot see them because they are hidden inside the rectum. Be patient - It can take months for this to improve or go away.   Hemorrhoids do not always cause symptoms. But when they do, symptoms can include: ?Itching of the skin around the anus ?Bleeding - Bleeding is usually painless. You might see bright red blood after using the toilet. ?Pain - If a blood clot forms inside a hemorrhoid, this can cause pain. It can also cause a lump that you might be able to feel.   What can I do to keep from getting more hemorrhoids? -- The most important thing you can do is to keep from getting constipated. You should have a bowel movement at least a few times a week. When you have a bowel movement, you also should not have to push too much. Plus, your bowel movements should not be too hard. Being constipated and having hard bowel movements can make hemorrhoids worse.   I have prescribed Anusol HC suppositories.  Insert into rectum twice per day for 6 days  HOME CARE: . Sitz Baths twice daily. Soak buttocks in 2 or 3 inches of warm water for 10 to 15 minutes. Do not add soap, bubble bath, or anything to the water. . Stool softener such as Colace 100 mg twice daily AND Miralax 1 scoop daily until you have regular soft stools . Over the counter Preparation H . Tucks Pads . Witch Hazel  Here are some steps you can take to avoid getting constipated or having hard stools:  ?Eat lots of fruits, vegetables, and other foods with fiber. Fiber helps to increase bowel movements. If you do not get enough fiber from your diet, you can take fiber supplements. These come in the form of powders, wafers, or pills.  Some examples are Metamucil, Citrucel, Benefiber and FiberCon. If you take a fiber supplement, be sure to read the label so you know how much to take. If you're not sure, ask your provider or nurse. ?Take medicines called "stool softeners" such as docusate sodium (sample brand names: Colace, Dulcolax). These medicines increase the number of bowel movements you have. They are safe to take and they can prevent problems later.  You should request a referral for a follow up evaluation with a Gastroenterologist (GI doctor) to evaluate this chronic and relapsing condition - even if it improves to see what further steps need to be taken. This is highly linked to chronic constipation and straining to have a bowel movement. It may require further treatment or surgical intervention.   GET HELP RIGHT AWAY IF: . You develop severe pain . You have heavy bleeding   FOLLOW UP WITH YOUR PRIMARY PROVIDER IF: . If your symptoms do not improve within 10 days  MAKE SURE YOU   Understand these instructions.  Will watch your condition.  Will get help right away if you are not doing well or get worse.  Your e-visit answers were reviewed by a board certified advanced clinical practitioner to complete your personal care plan. Depending upon the condition, your plan could have included both over the counter or prescription medications.  Your safety is important to   us. If you have drug allergies check your prescription carefully.   You can use MyChart to ask questions about today's visit, request a non-urgent call back, or ask for a work or school excuse for 24 hours related to this e-Visit. If it has been greater than 24 hours you will need to follow up with your provider, or enter a new e-Visit to address those concerns.  You will get an e-mail with a link to a survey asking about your experience.  We hope that your e-visit has been valuable and will speed your recovery! Thank you for using  e-visits.  Approximately 5 minutes was spent documenting and reviewing patient's chart.       

## 2020-10-25 ENCOUNTER — Other Ambulatory Visit: Payer: Self-pay

## 2020-10-25 ENCOUNTER — Ambulatory Visit (INDEPENDENT_AMBULATORY_CARE_PROVIDER_SITE_OTHER): Payer: BC Managed Care – PPO | Admitting: Family Medicine

## 2020-10-25 ENCOUNTER — Encounter: Payer: Self-pay | Admitting: Family Medicine

## 2020-10-25 VITALS — BP 128/90 | HR 61 | Resp 16 | Ht 63.75 in | Wt 160.0 lb

## 2020-10-25 DIAGNOSIS — K5909 Other constipation: Secondary | ICD-10-CM | POA: Diagnosis not present

## 2020-10-25 NOTE — Patient Instructions (Signed)
It was good to see you again today-  We will set you up to see Gi for further evaluation In the meantime please try a stool softener such as colace (docusate sodium) once a day- this may help with your constipation If you are getting worse or have any other concerns in the meantime please let me know!

## 2020-10-25 NOTE — Progress Notes (Signed)
[facility] [address], [address] [place], [place] [address] [phone]  Date:  10/25/2020   Name:  Pamela Villegas   DOB:  24-May-1976   MRN:  [id number]  PCP:  Pearline Cables, MD    Chief Complaint: Hemmorrhoids (Lesion on outside of rectum, constipation)   History of Present Illness:  Pamela Villegas is a 44 y.o. very pleasant female patient who presents with the following:  Generally healthy young woman here today with concern of possible hemorrhoids Without her most recently in January, virtual visit for illness  Flu vaccine- pt declines  COVID-19 vaccine- not done, recommended that she have this done  Pap is up-to-date Routine blood work was drawn in September at visit with her gynecologist  She did an E-visit for hemorrhoids last month as well Back in October she noted a firm mass in the perineal/anal area-no tenderness It seems to be pink/flesh-colored It will seem to come and go No bleeding She feels like she is constipated in general for the last several months, this is new for her She feels like she not emptying her bowels totally when she does have a bowel movement No nausea or vomiting No fever  Last period was about 1 week ago  Wt Readings from Last 3 Encounters:  10/25/20 160 lb (72.6 kg)  07/23/20 143 lb (64.9 kg)  08/02/19 152 lb 3.2 oz (69 kg)     Patient Active Problem List   Diagnosis Date Noted  . Right shoulder pain 05/17/2018  . Right carpal tunnel syndrome 05/17/2018  . Genetic testing 10/30/2017  . Family history of melanoma   . Family history of prostate cancer   . Family history of ovarian cancer 05/26/2017  . Family history of colon cancer 05/26/2017  . Chronic female pelvic pain 04/10/2017  . Cyst of right ovary 04/10/2017  . Pain of right hip joint 04/10/2017    Past Medical History:  Diagnosis Date  . Anxiety   . Chicken pox   . Depression   . Family history of colon  cancer   . Family history of melanoma   . Family history of ovarian cancer   . Family history of prostate cancer   . Hyperlipidemia   . Urinary tract infection     Past Surgical History:  Procedure Laterality Date  . ANKLE SURGERY Right 2000  . BREAST BIOPSY  2002  . WISDOM TOOTH EXTRACTION  1998    Social History   Tobacco Use  . Smoking status: Never Smoker  . Smokeless tobacco: Never Used  Vaping Use  . Vaping Use: Never used  Substance Use Topics  . Alcohol use: Not Currently  . Drug use: No    Family History  Problem Relation Age of Onset  . Ovarian cancer Mother 23       ovarian cancer  . Lung cancer Father 61       Lung cancer  . Osteoporosis Maternal Grandmother   . Colon cancer Maternal Grandfather        possibly dx under 50  . Esophageal cancer Maternal Grandfather 25  . Parkinsonism Paternal Grandmother   . Melanoma Sister 36       back   . Colon polyps Maternal Aunt   . Cervical cancer Maternal Aunt   . Colon cancer Maternal Uncle 65  . Prostate cancer Maternal Uncle 62    No Known Allergies  Medication list has been reviewed  and updated.  Current Outpatient Medications on File Prior to Visit  Medication Sig Dispense Refill  . b complex vitamins capsule Take 1 capsule by mouth daily.    . Coenzyme Q10-Fish Oil-Vit E (CO-Q 10 OMEGA-3 FISH OIL PO) Take by mouth.    . hydrocortisone (ANUSOL-HC) 25 MG suppository Place 1 suppository (25 mg total) rectally 2 (two) times daily. 12 suppository 0  . Multiple Vitamin (MULTI-VITAMIN DAILY PO) Take by mouth daily.    . Omega-3 1000 MG CAPS Take 1 capsule by mouth daily.    Marland Kitchen triamcinolone (KENALOG) 0.025 % cream     . VITAMIN C-VITAMIN D-ZINC PO Take by mouth.     No current facility-administered medications on file prior to visit.    Review of Systems:  As per HPI- otherwise negative.   Physical Examination: Vitals:   10/25/20 0848  BP: 128/90  Pulse: 61  Resp: 16  SpO2: 98%   Vitals:    10/25/20 0848  Weight: 160 lb (72.6 kg)  Height: 5' 3.75" (1.619 m)   Body mass index is 27.68 kg/m. Ideal Body Weight: Weight in (lb) to have BMI = 25: 144.2  GEN: no acute distress.  Overweight, otherwise looks well HEENT: Atraumatic, Normocephalic.  Ears and Nose: No external deformity. CV: RRR, No M/G/R. No JVD. No thrill. No extra heart sounds. PULM: CTA B, no wheezes, crackles, rhonchi. No retractions. No resp. distress. No accessory muscle use. ABD: S, NT, ND, +BS. No rebound. No HSM. EXTR: No c/c/e PSYCH: Normally interactive. Conversant.  Pelvic: normal, no vaginal lesions or discharge. Uterus normal, no CMT, no adnexal tendereness or masses Perineal area, rectal exam normal.  I do not appreciate any evidence of hemorrhoids.  On Valsalva there is no apparent vaginal prolapse     Assessment and Plan: Other constipation - Plan: Ambulatory referral to Gastroenterology Patient is here today with concern of bowel habit changes and constipation, and also a possible external hemorrhoid which will come and go.  Her exam today is quite normal, I am not able to appreciate any visible hemorrhoid or prolapse She does note a family history of colon cancer, especially given bowel habit changes she would like to see GI as she is concerned.  We are glad to make this referral I also asked her to let me know the next time the anal mass is visible, I am glad to try to see her for an exam at that time He will let me know if any changes or concerns in the meantime This visit occurred during the SARS-CoV-2 public health emergency.  Safety protocols were in place, including screening questions prior to the visit, additional usage of staff PPE, and extensive cleaning of exam room while observing appropriate contact time as indicated for disinfecting solutions.    Signed Abbe Amsterdam, MD

## 2020-12-07 ENCOUNTER — Encounter: Payer: Self-pay | Admitting: Gastroenterology

## 2020-12-10 ENCOUNTER — Encounter: Payer: Self-pay | Admitting: Gastroenterology

## 2020-12-20 ENCOUNTER — Other Ambulatory Visit: Payer: Self-pay

## 2020-12-20 ENCOUNTER — Encounter: Payer: Self-pay | Admitting: Gastroenterology

## 2020-12-20 ENCOUNTER — Ambulatory Visit: Payer: BC Managed Care – PPO | Admitting: Gastroenterology

## 2020-12-20 ENCOUNTER — Other Ambulatory Visit: Payer: BC Managed Care – PPO

## 2020-12-20 VITALS — BP 118/74 | HR 60 | Ht 64.0 in | Wt 158.5 lb

## 2020-12-20 DIAGNOSIS — Z800 Family history of malignant neoplasm of digestive organs: Secondary | ICD-10-CM

## 2020-12-20 DIAGNOSIS — R194 Change in bowel habit: Secondary | ICD-10-CM | POA: Diagnosis not present

## 2020-12-20 DIAGNOSIS — K649 Unspecified hemorrhoids: Secondary | ICD-10-CM

## 2020-12-20 DIAGNOSIS — R197 Diarrhea, unspecified: Secondary | ICD-10-CM

## 2020-12-20 MED ORDER — CLENPIQ 10-3.5-12 MG-GM -GM/160ML PO SOLN: 1.0000 | ORAL | 0 refills | Status: DC

## 2020-12-20 NOTE — Patient Instructions (Addendum)
If you are age 43 or older, your body mass index should be between 23-30. Your Body mass index is 27.21 kg/m. If this is out of the aforementioned range listed, please consider follow up with your Primary Care Provider.  If you are age 69 or younger, your body mass index should be between 19-25. Your Body mass index is 27.21 kg/m. If this is out of the aformentioned range listed, please consider follow up with your Primary Care Provider.   Please go to the lab on the 2nd floor [address] before you leave the office today.    We have sent the following medications to your pharmacy for you to pick up at your convenience: clenpiq for colonoscopy  Due to recent changes in healthcare laws, you may see the results of your imaging and laboratory studies on MyChart before your provider has had a chance to review them.  We understand that in some cases there may be results that are confusing or concerning to you. Not all laboratory results come back in the same time frame and the provider may be waiting for multiple results in order to interpret others.  Please give [place] 48 hours in order for your provider to thoroughly review all the results before contacting the office for clarification of your results.   Thank you for choosing me and [facility].  Vito Cirigliano, D.O.

## 2020-12-20 NOTE — Progress Notes (Signed)
Chief Complaint: Symptomatic hemorrhoids, change in bowel habits   Referring Provider:     Pearline Cables, MD   HPI:     Pamela Villegas is a 45 y.o. female referred to the Gastroenterology Clinic for evaluation of intermittent diarrhea for the last year and perianal sxs.  Diarrhea is non-bloody and intermittent, with up to 4 loose stools/day.  No preceding changes in diet, medications, supplements, etc. No associated abdominal pain, n/v/f/c, hematochezia, melena. Started fiber supplement gummies daily in November.   Felt "something protruding" from rectum in November x3 weeks.  She treated with topical hemorrhoid cream and sxs resolved without recurrence.  No associated pain.  Seen by her PCM for this issue on 10/25/2020.  Rectal exam without any obvious hemorrhoids.  Normal pelvic exam.  Family history of colon cancer- MGF.   No previous EGD/colonoscopy.    Past Medical History:  Diagnosis Date  . Anxiety   . Chicken pox   . Depression   . Family history of colon cancer   . Family history of melanoma   . Family history of ovarian cancer   . Family history of prostate cancer   . Hyperlipidemia   . Urinary tract infection      Past Surgical History:  Procedure Laterality Date  . ANKLE SURGERY Right 2000  . BREAST BIOPSY  2002  . WISDOM TOOTH EXTRACTION  1998   Family History  Problem Relation Age of Onset  . Ovarian cancer Mother 71       ovarian cancer  . Lung cancer Father 87       Lung cancer  . Osteoporosis Maternal Grandmother   . Colon cancer Maternal Grandfather        possibly dx under 50  . Esophageal cancer Maternal Grandfather 19  . Parkinsonism Paternal Grandmother   . Melanoma Sister 36       back   . Colon polyps Maternal Aunt   . Cervical cancer Maternal Aunt   . Colon cancer Maternal Uncle 65  . Prostate cancer Maternal Uncle 42   Social History   Tobacco Use  . Smoking status: Never Smoker  . Smokeless tobacco: Never  Used  Vaping Use  . Vaping Use: Never used  Substance Use Topics  . Alcohol use: Not Currently  . Drug use: No   Current Outpatient Medications  Medication Sig Dispense Refill  . b complex vitamins capsule Take 1 capsule by mouth daily.    . Coenzyme Q10-Fish Oil-Vit E (CO-Q 10 OMEGA-3 FISH OIL PO) Take by mouth.    . Multiple Vitamin (MULTI-VITAMIN DAILY PO) Take by mouth daily.    . Omega-3 1000 MG CAPS Take 1 capsule by mouth daily.    Marland Kitchen VITAMIN C-VITAMIN D-ZINC PO Take by mouth.     No current facility-administered medications for this visit.   No Known Allergies   Review of Systems: All systems reviewed and negative except where noted in HPI.     Physical Exam:    Wt Readings from Last 3 Encounters:  12/20/20 158 lb 8 oz (71.9 kg)  10/25/20 160 lb (72.6 kg)  07/23/20 143 lb (64.9 kg)    BP 118/74   Pulse 60   Ht 5\' 4"  (1.626 m)   Wt 158 lb 8 oz (71.9 kg)   BMI 27.21 kg/m  Constitutional:  Pleasant, in no acute distress. Psychiatric: Normal mood and affect. Behavior is normal.  Rectal: Exam deferred to time of colonoscopy.    ASSESSMENT AND PLAN;   1) Diarrhea 2) Change in bowel habits 3) Probable hemorrhoids 4) Family history of colon cancer  -Colonoscopy to evaluate for mucosal/luminal pathology - GI PCR, fecal calprotectin -Evaluate for hemorrhoids at time of colonoscopy.  If clinically significant internal hemorrhoids, did briefly discuss potential hemorrhoid banding -Try changing from fiber Gummies to Citrucel or Benefiber  The indications, risks, and benefits of colonoscopy were explained to the patient in detail. Risks include but are not limited to bleeding, perforation, adverse reaction to medications, and cardiopulmonary compromise. Sequelae include but are not limited to the possibility of surgery, hospitalization, and mortality. The patient verbalized understanding and wished to proceed. All questions answered, referred to the scheduler and bowel  prep ordered. Further recommendations pending results of the exam.     Shellia Cleverly, DO, FACG  12/20/2020, 2:37 PM   Cop[place], Gwenlyn Found, MD

## 2020-12-28 ENCOUNTER — Other Ambulatory Visit: Payer: BC Managed Care – PPO

## 2020-12-28 ENCOUNTER — Other Ambulatory Visit: Payer: Self-pay

## 2020-12-28 DIAGNOSIS — R197 Diarrhea, unspecified: Secondary | ICD-10-CM | POA: Diagnosis not present

## 2020-12-28 DIAGNOSIS — K649 Unspecified hemorrhoids: Secondary | ICD-10-CM

## 2021-01-01 LAB — CALPROTECTIN, FECAL: Calprotectin, Fecal: <16 ug/g (ref 0–120)

## 2021-01-02 LAB — GI PROFILE, STOOL, PCR

## 2021-02-12 ENCOUNTER — Other Ambulatory Visit: Payer: Self-pay | Admitting: Gastroenterology

## 2021-02-12 DIAGNOSIS — Z1152 Encounter for screening for COVID-19: Secondary | ICD-10-CM | POA: Diagnosis not present

## 2021-02-19 LAB — SARS CORONAVIRUS 2 (TAT 6-24 HRS): SARS Coronavirus 2: NEGATIVE

## 2021-02-20 ENCOUNTER — Other Ambulatory Visit: Payer: Self-pay

## 2021-02-20 ENCOUNTER — Ambulatory Visit (AMBULATORY_SURGERY_CENTER): Payer: BC Managed Care – PPO | Admitting: Gastroenterology

## 2021-02-20 ENCOUNTER — Encounter: Payer: Self-pay | Admitting: Gastroenterology

## 2021-02-20 VITALS — BP 112/54 | HR 53 | Temp 98.0°F | Resp 11 | Ht 64.0 in | Wt 158.0 lb

## 2021-02-20 DIAGNOSIS — R194 Change in bowel habit: Secondary | ICD-10-CM

## 2021-02-20 DIAGNOSIS — R197 Diarrhea, unspecified: Secondary | ICD-10-CM

## 2021-02-20 DIAGNOSIS — K640 First degree hemorrhoids: Secondary | ICD-10-CM

## 2021-02-20 MED ORDER — SODIUM CHLORIDE 0.9 % IV SOLN: 500.0000 mL | Freq: Once | INTRAVENOUS | Status: DC

## 2021-02-20 NOTE — Op Note (Signed)
[facility] Patient Name: Pamela Villegas Procedure Date: 02/20/2021 11:40 AM MRN: [id number] Endoscopist: Doristine Locks , MD Age: 45 Referring MD:  Date of Birth: 19-Jul-1976 Gender: Female Account #: [id number] Procedure:                Colonoscopy Indications:              Change in bowel habits, Diarrhea Medicines:                Monitored Anesthesia Care Procedure:                Pre-Anesthesia Assessment:                           - Prior to the procedure, a History and Physical                            was performed, and patient medications and                            allergies were reviewed. The patient's tolerance of                            previous anesthesia was also reviewed. The risks                            and benefits of the procedure and the sedation                            options and risks were discussed with the patient.                            All questions were answered, and informed consent                            was obtained. Prior Anticoagulants: The patient has                            taken no previous anticoagulant or antiplatelet                            agents. ASA Grade Assessment: II - A patient with                            mild systemic disease. After reviewing the risks                            and benefits, the patient was deemed in                            satisfactory condition to undergo the procedure.                           After obtaining informed consent, the colonoscope  was passed under direct vision. Throughout the                            procedure, the patient's blood pressure, pulse, and                            oxygen saturations were monitored continuously. The                            Colonoscope was introduced through the anus and                            advanced to the the terminal ileum. The colonoscopy                            was performed without  difficulty. The patient                            tolerated the procedure well. The quality of the                            bowel preparation was good. The terminal ileum,                            ileocecal valve, appendiceal orifice, and rectum                            were photographed. Scope In: 11:52:10 AM Scope Out: 12:07:30 PM Total Procedure Duration: 0 hours 15 minutes 20 seconds  Findings:                 The perianal and digital rectal examinations were                            normal.                           The colon (entire examined portion) appeared                            normal. Biopsies for histology were taken with a                            cold forceps from the right colon and left colon                            for evaluation of microscopic colitis. Estimated                            blood loss was minimal.                           Non-bleeding internal hemorrhoids were found during  retroflexion. The hemorrhoids were small and Grade                            I (internal hemorrhoids that do not prolapse).                           The terminal ileum appeared normal. Complications:            No immediate complications. Estimated Blood Loss:     Estimated blood loss was minimal. Impression:               - The entire examined colon is normal. Biopsied.                           - Non-bleeding internal hemorrhoids.                           - The examined portion of the ileum was normal. Recommendation:           - Patient has a contact number available for                            emergencies. The signs and symptoms of potential                            delayed complications were discussed with the                            patient. Return to normal activities tomorrow.                            Written discharge instructions were provided to the                            patient.                           -  Resume previous diet.                           - Continue present medications.                           - Await pathology results.                           - Repeat colonoscopy in 10 years for screening                            purposes.                           - Return to GI office PRN.                           - Use fiber, for example Citrucel, Fibercon, [employer]  or Metamucil. Doristine LocksVito Latina Frank, MD 02/20/2021 12:10:54 PM

## 2021-02-20 NOTE — Progress Notes (Signed)
Pt's states no medical or surgical changes since previsit or office visit. 

## 2021-02-20 NOTE — Patient Instructions (Signed)
Discharge instructions given. °Handout on Hemorrhoids. °Resume previous medications. °YOU HAD AN ENDOSCOPIC PROCEDURE TODAY AT THE [facility]:   Refer to the procedure report that was given to you for any specific questions about what was found during the examination.  If the procedure report does not answer your questions, please call your gastroenterologist to clarify.  If you requested that your care partner not be given the details of your procedure findings, then the procedure report has been included in a sealed envelope for you to review at your convenience later. ° °YOU SHOULD EXPECT: Some feelings of bloating in the abdomen. Passage of more gas than usual.  Walking can help get rid of the air that was put into your GI tract during the procedure and reduce the bloating. If you had a lower endoscopy (such as a colonoscopy or flexible sigmoidoscopy) you may notice spotting of blood in your stool or on the toilet paper. If you underwent a bowel prep for your procedure, you may not have a normal bowel movement for a few days. ° °Please Note:  You might notice some irritation and congestion in your nose or some drainage.  This is from the oxygen used during your procedure.  There is no need for concern and it should clear up in a day or so. ° °SYMPTOMS TO REPORT IMMEDIATELY: ° °Following lower endoscopy (colonoscopy or flexible sigmoidoscopy): ° Excessive amounts of blood in the stool ° Significant tenderness or worsening of abdominal pains ° Swelling of the abdomen that is new, acute ° Fever of 100°F or higher ° ° °For urgent or emergent issues, a gastroenterologist can be reached at any hour by calling [phone]. °Do not use MyChart messaging for urgent concerns.  ° ° °DIET:  We do recommend a small meal at first, but then you may proceed to your regular diet.  Drink plenty of fluids but you should avoid alcoholic beverages for 24 hours. ° °ACTIVITY:  You should plan to take it easy for the  rest of today and you should NOT DRIVE or use heavy machinery until tomorrow (because of the sedation medicines used during the test).   ° °FOLLOW UP: °Our staff will call the number listed on your records 48-72 hours following your procedure to check on you and address any questions or concerns that you may have regarding the information given to you following your procedure. If we do not reach you, we will leave a message.  We will attempt to reach you two times.  During this call, we will ask if you have developed any symptoms of COVID 19. If you develop any symptoms (ie: fever, flu-like symptoms, shortness of breath, cough etc.) before then, please call [phone].  If you test positive for Covid 19 in the 2 weeks post procedure, please call and report this information to us.   ° °If any biopsies were taken you will be contacted by phone or by letter within the next 1-3 weeks.  Please call us at [phone] if you have not heard about the biopsies in 3 weeks.  ° ° °SIGNATURES/CONFIDENTIALITY: °You and/or your care partner have signed paperwork which will be entered into your electronic medical record.  These signatures attest to the fact that that the information above on your After Visit Summary has been reviewed and is understood.  Full responsibility of the confidentiality of this discharge information lies with you and/or your care-partner.  °

## 2021-02-20 NOTE — Progress Notes (Signed)
C.W. vital signs. RN

## 2021-02-20 NOTE — Progress Notes (Signed)
Called to room to assist during endoscopic procedure.  Patient ID and intended procedure confirmed with present staff. Received instructions for my participation in the procedure from the performing physician.  

## 2021-03-07 ENCOUNTER — Encounter: Payer: Self-pay | Admitting: Gastroenterology

## 2021-04-02 NOTE — Progress Notes (Addendum)
[facility] [address], [address] [place], [place] [address] [phone]  Date:  04/04/2021   Name:  Pamela Villegas   DOB:  Dec 01, 1975   MRN:  [id number]  PCP:  Pearline Cables, MD    Chief Complaint: Urinary Tract Infection (Vaginal irritation, urinary urgency, no other symptoms, started last week)   History of Present Illness:  Pamela Villegas is a 45 y.o. very pleasant female patient who presents with the following:  Pt here today with concern of UTI Last visit with myself in December  She has noted sx of a UTI last week- she had some macrobid at home and took a full course, 100 mg BID for 1 week She feels like the UTI is mostly clear- some urgency left but improved We will reculture her urine today  She now notes a rash over her body She started the macrobid 10 days ago and finished it prior to development of rash; the rash appeared yesterday. She notes an itchy, macular rash which is most apparent on her ankles, upper legs and a little bit on her back She otherwise feels well, no fever or chills, no flulike symptoms She is not aware of any tick bite States no risk of current pregnancy    Patient Active Problem List   Diagnosis Date Noted  . Right shoulder pain 05/17/2018  . Right carpal tunnel syndrome 05/17/2018  . Genetic testing 10/30/2017  . Family history of melanoma   . Family history of prostate cancer   . Family history of ovarian cancer 05/26/2017  . Family history of colon cancer 05/26/2017  . Chronic female pelvic pain 04/10/2017  . Cyst of right ovary 04/10/2017  . Pain of right hip joint 04/10/2017    Past Medical History:  Diagnosis Date  . Anxiety   . Chicken pox   . Depression   . Family history of colon cancer   . Family history of melanoma   . Family history of ovarian cancer   . Family history of prostate cancer   . Hyperlipidemia   . Urinary tract infection     Past Surgical  History:  Procedure Laterality Date  . ANKLE SURGERY Right 2000  . BREAST BIOPSY  2002  . WISDOM TOOTH EXTRACTION  1998    Social History   Tobacco Use  . Smoking status: Never Smoker  . Smokeless tobacco: Never Used  Vaping Use  . Vaping Use: Never used  Substance Use Topics  . Alcohol use: Not Currently  . Drug use: No    Family History  Problem Relation Age of Onset  . Ovarian cancer Mother 73       ovarian cancer  . Lung cancer Father 61       Lung cancer  . Osteoporosis Maternal Grandmother   . Colon cancer Maternal Grandfather        possibly dx under 50  . Esophageal cancer Maternal Grandfather 31  . Parkinsonism Paternal Grandmother   . Melanoma Sister 36       back   . Colon polyps Maternal Aunt   . Cervical cancer Maternal Aunt   . Colon cancer Maternal Uncle 65  . Prostate cancer Maternal Uncle 62    No Known Allergies  Medication list has been reviewed and updated.  Current Outpatient Medications on File Prior to Visit  Medication Sig Dispense Refill  . b complex vitamins capsule Take 1 capsule by mouth  daily.    . Coenzyme Q10-Fish Oil-Vit E (CO-Q 10 OMEGA-3 FISH OIL PO) Take by mouth.    . Multiple Vitamin (MULTI-VITAMIN DAILY PO) Take by mouth daily.    . Omega-3 1000 MG CAPS Take 1 capsule by mouth daily.    Marland Kitchen VITAMIN C-VITAMIN D-ZINC PO Take by mouth.     No current facility-administered medications on file prior to visit.    Review of Systems:  As per HPI- otherwise negative.   Physical Examination: Vitals:   04/04/21 1353  BP: 112/62  Pulse: 64  Resp: 16  SpO2: 98%   Vitals:   04/04/21 1353  Weight: 160 lb (72.6 kg)  Height: 5\' 4"  (1.626 m)   Body mass index is 27.46 kg/m. Ideal Body Weight: Weight in (lb) to have BMI = 25: 145.3  GEN: no acute distress.  Mild overweight, looks well HEENT: Atraumatic, Normocephalic.  Ears and Nose: No external deformity. CV: RRR, No M/G/R. No JVD. No thrill. No extra heart sounds. PULM:  CTA B, no wheezes, crackles, rhonchi. No retractions. No resp. distress. No accessory muscle use. ABD: S, NT, ND, +BS. No rebound. No HSM. EXTR: No c/c/e PSYCH: Normally interactive. Conversant.  She has a non specific macular erythematous rash - most apparent on the ankles, some on the thighs and trunk as pictured below       Results for orders placed or performed in visit on 04/04/21  POCT urinalysis dipstick  Result Value Ref Range   Color, UA yellow yellow   Clarity, UA clear clear   Glucose, UA negative negative mg/dL   Bilirubin, UA negative negative   Ketones, POC UA negative negative mg/dL   Spec Grav, UA 06/04/21 5.885 - 1.025   Blood, UA negative negative   pH, UA 6.0 5.0 - 8.0   Protein Ur, POC negative negative mg/dL   Urobilinogen, UA 0.2 0.2 or 1.0 E.U./dL   Nitrite, UA Negative Negative   Leukocytes, UA Negative Negative    Assessment and Plan: Dysuria - Plan: Urine Culture, POCT urinalysis dipstick, fluconazole (DIFLUCAN) 150 MG tablet, nitrofurantoin, macrocrystal-monohydrate, (MACROBID) 100 MG capsule  Rash - Plan: predniSONE (DELTASONE) 20 MG tablet Patient today for follow-up of recent UTI.  She self treated with an appropriate course of Macrobid.  We will check a urine culture today to make sure she is clear.  She does note suspicion of possible yeast vaginitis, prescribed Diflucan for her to use as needed  She is going on a hiking trip next week, requests a course of Macrobid to take with her in case she does have recurrent symptoms.  At this point I doubt her rash is related to Macrobid, but I advised her if the rash recurs again after taking this medication she should stop using it and please let me know  Nonspecific rash on ankles and legs.  Uncertain etiology.  This does not appear to be vasculitis, no symptoms of [facility] spotted fever.  We will treat her with prednisone 40 mg for 3 days, 20 mg for 3 days-she will let me know if not improved in the  next couple of days, sooner if worse  This visit occurred during the SARS-CoV-2 public health emergency.  Safety protocols were in place, including screening questions prior to the visit, additional usage of staff PPE, and extensive cleaning of exam room while observing appropriate contact time as indicated for disinfecting solutions.    Signed [facility], MD  Addendum 6/5, received urine culture as  below Results for orders placed or performed in visit on 04/04/21  Urine Culture   Specimen: Urine  Result Value Ref Range   MICRO NUMBER: [id number]    SPECIMEN QUALITY: Adequate    Sample Source NOT GIVEN    STATUS: FINAL    Result: No Growth   POCT urinalysis dipstick  Result Value Ref Range   Color, UA yellow yellow   Clarity, UA clear clear   Glucose, UA negative negative mg/dL   Bilirubin, UA negative negative   Ketones, POC UA negative negative mg/dL   Spec Grav, UA 4.696 2.952 - 1.025   Blood, UA negative negative   pH, UA 6.0 5.0 - 8.0   Protein Ur, POC negative negative mg/dL   Urobilinogen, UA 0.2 0.2 or 1.0 E.U./dL   Nitrite, UA Negative Negative   Leukocytes, UA Negative Negative

## 2021-04-02 NOTE — Patient Instructions (Addendum)
Good to see you today- I will be in touch with your urine culture asap  Your urine dip looks good- I think you have cleared up your UTI Hold the macrobid rx to use in case you have a UTI later.  I don't think your rash is related to macrobid/ nitrofurantoin use- however, if rash occurs again after use of this med stop the macrobid and contact me  Prednisone for 6 days for your rash and itching Diflucan to use for vaginal itching

## 2021-04-04 ENCOUNTER — Other Ambulatory Visit: Payer: Self-pay

## 2021-04-04 ENCOUNTER — Encounter: Payer: Self-pay | Admitting: Family Medicine

## 2021-04-04 ENCOUNTER — Ambulatory Visit: Payer: BC Managed Care – PPO | Admitting: Family Medicine

## 2021-04-04 VITALS — BP 112/62 | HR 64 | Resp 16 | Ht 64.0 in | Wt 160.0 lb

## 2021-04-04 DIAGNOSIS — R300 Dysuria: Secondary | ICD-10-CM | POA: Diagnosis not present

## 2021-04-04 DIAGNOSIS — R21 Rash and other nonspecific skin eruption: Secondary | ICD-10-CM | POA: Diagnosis not present

## 2021-04-04 LAB — POCT URINALYSIS DIP (MANUAL ENTRY)
Bilirubin, UA: NEGATIVE
Blood, UA: NEGATIVE
Glucose, UA: NEGATIVE mg/dL
Ketones, POC UA: NEGATIVE mg/dL
Leukocytes, UA: NEGATIVE
Nitrite, UA: NEGATIVE
Protein Ur, POC: NEGATIVE mg/dL
Spec Grav, UA: 1.025
Urobilinogen, UA: 0.2 U/dL
pH, UA: 6

## 2021-04-04 MED ORDER — FLUCONAZOLE 150 MG PO TABS: 150.0000 mg | ORAL_TABLET | Freq: Once | ORAL | 0 refills | Status: AC

## 2021-04-04 MED ORDER — PREDNISONE 20 MG PO TABS: ORAL_TABLET | ORAL | 0 refills | Status: DC

## 2021-04-04 MED ORDER — NITROFURANTOIN MONOHYD MACRO 100 MG PO CAPS: 100.0000 mg | ORAL_CAPSULE | Freq: Two times a day (BID) | ORAL | 0 refills | Status: DC

## 2021-04-05 LAB — URINE CULTURE
MICRO NUMBER:: 11961264
Result:: NO GROWTH
SPECIMEN QUALITY:: ADEQUATE

## 2021-04-07 ENCOUNTER — Encounter: Payer: Self-pay | Admitting: Family Medicine

## 2021-06-10 ENCOUNTER — Encounter: Payer: Self-pay | Admitting: Family Medicine

## 2021-07-08 NOTE — Progress Notes (Signed)
[facility] [address] [place], [place] [address] [phone]  Date:  07/10/2021   Name:  Pamela Villegas   DOB:  [birth year]   MRN:  [id number]  PCP:  Pearline Cables, MD    Chief Complaint: STD screen (Pt asks for all STI testing, no known exposures.)   History of Present Illness:  Pamela Villegas is a 45 y.o. very pleasant female patient who presents with the following:  Patient seen today for concern of STI testing-no particular concerns, she would just like routine screening. 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  She is concerned about possibly transferring this infection to others.  We discussed using suppressive therapy and she would like to try this  Patient Active Problem List   Diagnosis Date Noted   Right shoulder pain 05/17/2018   Right carpal tunnel syndrome 05/17/2018   Genetic testing 10/30/2017   Family history of melanoma    Family history of prostate cancer    Family history of ovarian cancer 05/26/2017   Family history of colon cancer 05/26/2017   Chronic female pelvic pain 04/10/2017   Cyst of right ovary 04/10/2017   Pain of right hip joint 04/10/2017    Past Medical History:  Diagnosis Date   Anxiety    Chicken pox    Depression    Family history of colon cancer    Family history of melanoma    Family history of ovarian cancer    Family history of prostate cancer    Hyperlipidemia    Urinary tract infection     Past Surgical History:  Procedure Laterality Date   ANKLE SURGERY Right 2000   BREAST BIOPSY  2002   WISDOM TOOTH EXTRACTION  1998    Social History   Tobacco Use    Smoking status: Never   Smokeless tobacco: Never  Vaping Use   Vaping Use: Never used  Substance Use Topics   Alcohol use: Not Currently   Drug use: No    Family History  Problem Relation Age of Onset   Ovarian cancer Mother 45       ovarian cancer   Lung cancer Father 45       Lung cancer   Osteoporosis Maternal Grandmother    Colon cancer Maternal Grandfather        possibly dx under 50   Esophageal cancer Maternal Grandfather 69   Parkinsonism Paternal Grandmother    Melanoma Sister 22       back    Colon polyps Maternal Aunt    Cervical cancer Maternal Aunt    Colon cancer Maternal Uncle 65   Prostate cancer Maternal Uncle 62    No Known Allergies  Medication list has been reviewed and updated.  Current Outpatient Medications on File Prior to Visit  Medication Sig Dispense Refill   b complex vitamins capsule Take 1 capsule by mouth daily.     Coenzyme Q10-Fish Oil-Vit E (CO-Q 10 OMEGA-3 FISH OIL PO) Take by mouth.     Multiple Vitamin (MULTI-VITAMIN DAILY PO) Take by mouth daily.     Omega-3 1000 MG CAPS Take  1 capsule by mouth daily.     VITAMIN C-VITAMIN D-ZINC PO Take by mouth.     No current facility-administered medications on file prior to visit.    Review of Systems:  As per HPI- otherwise negative.   Physical Examination: Vitals:   07/10/21 1558  BP: 118/78  Pulse: 78  Resp: 18  Temp: 98.5 F (36.9 C)  SpO2: 98%   Vitals:   07/10/21 1558  Weight: 160 lb (72.6 kg)   Body mass index is 27.46 kg/m. Ideal Body Weight:    GEN: no acute distress.  Mild overweight, looks well HEENT: Atraumatic, Normocephalic.  Ears and Nose: No external deformity. CV: RRR, No M/G/R. No JVD. No thrill. No extra heart sounds. PULM: CTA B, no wheezes, crackles, rhonchi. No retractions. No resp. distress. No accessory muscle use. EXTR: No c/c/e PSYCH: Normally interactive. Conversant.    Assessment and Plan: Routine screening for STI (sexually transmitted  infection) - Plan: HIV Antibody (routine testing w rflx), Hepatitis C antibody, RPR, HSV(herpes simplex vrs) 1+2 ab-IgG, Hepatitis B surface antigen  HSV-1 infection - Plan: valACYclovir (VALTREX) 500 MG tablet  STI screening blood work is pending as above, I will be in touch with these results We will have her use Valtrex 500 daily for HSV-1 suppressive therapy.  She will let me know if any problems or concerns We discussed transmissibility of HSV, this is most likely to occur during outbreak but can be unpredictable.  Suppressive therapy does not guarantee inability to infect others, but can be helpful and decrease risk Recommend flu shot and Pap screening soon  This visit occurred during the SARS-CoV-2 public health emergency.  Safety protocols were in place, including screening questions prior to the visit, additional usage of staff PPE, and extensive cleaning of exam room while observing appropriate contact time as indicated for disinfecting solutions.   Signed Abbe Amsterdam, MD

## 2021-07-10 ENCOUNTER — Other Ambulatory Visit: Payer: Self-pay

## 2021-07-10 ENCOUNTER — Ambulatory Visit: Payer: BC Managed Care – PPO | Admitting: Family Medicine

## 2021-07-10 VITALS — BP 118/78 | HR 78 | Temp 98.5°F | Resp 18 | Wt 160.0 lb

## 2021-07-10 DIAGNOSIS — B009 Herpesviral infection, unspecified: Secondary | ICD-10-CM | POA: Diagnosis not present

## 2021-07-10 DIAGNOSIS — Z113 Encounter for screening for infections with a predominantly sexual mode of transmission: Secondary | ICD-10-CM

## 2021-07-10 MED ORDER — VALACYCLOVIR HCL 500 MG PO TABS: 500.0000 mg | ORAL_TABLET | Freq: Every day | ORAL | 3 refills | Status: DC

## 2021-07-10 NOTE — Patient Instructions (Signed)
Good to see you today!  I will be in touch with your labs asap Can take valtrex 500 mg daily to help reduce risk of oral herpes outbreak or transmission to others

## 2021-07-11 LAB — HEPATITIS C ANTIBODY
Hepatitis C Ab: NONREACTIVE
SIGNAL TO CUT-OFF: 0.03 (ref <1.00)

## 2021-07-11 LAB — HSV(HERPES SIMPLEX VRS) I + II AB-IGG
HAV 1 IGG,TYPE SPECIFIC AB: 4.19 index — ABNORMAL HIGH
HSV 2 IGG,TYPE SPECIFIC AB: <0.9 index

## 2021-07-11 LAB — RPR: RPR Ser Ql: NONREACTIVE

## 2021-07-11 LAB — HEPATITIS B SURFACE ANTIGEN: Hepatitis B Surface Ag: NONREACTIVE

## 2021-07-11 LAB — HIV ANTIBODY (ROUTINE TESTING W REFLEX): HIV 1&2 Ab, 4th Generation: NONREACTIVE

## 2021-07-24 NOTE — Progress Notes (Signed)
45 y.o. G0P0 Divorced White or Caucasian Not Hispanic or Latino female here for annual exam.   Menses q month x 3-7 days. She can saturate a super tampon in 3 hours. No BTB. Cramps are bad, helped with midol.    Not sexually active.   She has a FH of ovarian cancer, melanoma and colon cancer. Negative genetic testing, TC breast cancer risk is 10.7%  She had +testing for HSV 1, has never had an outbreak.   Patient's last menstrual period was 07/04/2021 (approximate).          Sexually active: No.  The current method of family planning is abstinence.    Exercising: Yes.     Cardio  Smoker:  no  Health Maintenance: Pap:  07/19/18 Neg:Neg HR HPV History of abnormal Pap:  no MMG:  08/15/19  density C Bi-rads 1 neg  BMD:   none  Colonoscopy: 02/20/21  F/u 10 years  TDaP:  07/28/18  Gardasil: yes complete    reports that she has never smoked. She has never used smokeless tobacco. She reports that she does not currently use alcohol. She reports that she does not use drugs. Sober for almost 3 years. Working as Product manager  Past Medical History:  Diagnosis Date   Anxiety    Chicken pox    Depression    Family history of colon cancer    Family history of melanoma    Family history of ovarian cancer    Family history of prostate cancer    Hyperlipidemia    Urinary tract infection     Past Surgical History:  Procedure Laterality Date   ANKLE SURGERY Right 2000   BREAST BIOPSY  2002   WISDOM TOOTH EXTRACTION  1998    Current Outpatient Medications  Medication Sig Dispense Refill   b complex vitamins capsule Take 1 capsule by mouth daily.     Coenzyme Q10-Fish Oil-Vit E (CO-Q 10 OMEGA-3 FISH OIL PO) Take by mouth.     Multiple Vitamin (MULTI-VITAMIN DAILY PO) Take by mouth daily.     Omega-3 1000 MG CAPS Take 1 capsule by mouth daily.     valACYclovir (VALTREX) 500 MG tablet Take 1 tablet (500 mg total) by mouth daily. 90 tablet 3   VITAMIN C-VITAMIN D-ZINC PO Take by  mouth.     No current facility-administered medications for this visit.    Family History  Problem Relation Age of Onset   Ovarian cancer Mother 64       ovarian cancer   Lung cancer Father 44       Lung cancer   Osteoporosis Maternal Grandmother    Colon cancer Maternal Grandfather        possibly dx under 50   Esophageal cancer Maternal Grandfather 66   Parkinsonism Paternal Grandmother    Melanoma Sister 43       back    Colon polyps Maternal Aunt    Cervical cancer Maternal Aunt    Colon cancer Maternal Uncle 65   Prostate cancer Maternal Uncle 70    Review of Systems  All other systems reviewed and are negative.  Exam:   BP 122/72   Pulse 66   Ht 5' 3.5" (1.613 m)   Wt 154 lb (69.9 kg)   LMP 07/04/2021 (Approximate)   SpO2 99%   BMI 26.85 kg/m   Weight change: @WEIGHTCHANGE @ Height:   Height: 5' 3.5" (161.3 cm)  Ht Readings from Last 3 Encounters:  07/29/21  5' 3.5" (1.613 m)  04/04/21 5\' 4"  (1.626 m)  02/20/21 5\' 4"  (1.626 m)    General appearance: alert, cooperative and appears stated age Head: Normocephalic, without obvious abnormality, atraumatic Neck: no adenopathy, supple, symmetrical, trachea midline and thyroid normal to inspection and palpation Lungs: clear to auscultation bilaterally Cardiovascular: regular rate and rhythm Breasts: normal appearance, no masses or tenderness Abdomen: soft, non-tender; non distended,  no masses,  no organomegaly Extremities: extremities normal, atraumatic, no cyanosis or edema Skin: Skin color, texture, turgor normal. No rashes or lesions Lymph nodes: Cervical, supraclavicular, and axillary nodes normal. No abnormal inguinal nodes palpated Neurologic: Grossly normal   Pelvic: External genitalia:  no lesions              Urethra:  normal appearing urethra with no masses, tenderness or lesions              Bartholins and Skenes: normal                 Vagina: normal appearing vagina with normal color and discharge,  no lesions              Cervix:  small cervical polyp               Bimanual Exam:  Uterus:  normal size, contour, position, consistency, mobility, non-tender and anteverted              Adnexa: no mass, fullness, tenderness               Rectovaginal: Confirms               Anus:  normal sphincter tone, no lesions  02/22/21 chaperoned for the exam.  1. Well woman exam Discussed breast self exam Discussed calcium and vit D intake Mammogram overdue, she will schedule She had a colonoscopy this year, normal Needs skin check, she is aware  2. Laboratory exam ordered as part of routine general medical examination - CBC - Comprehensive metabolic panel - Lipid panel  3. Screening examination for STD (sexually transmitted disease) Blood work was done earlier this month.  - SURESWAB CT/NG/T. Vaginalis  4. Cervical polyp Small and not symptomatic, doesn't need to be removed.

## 2021-07-29 ENCOUNTER — Other Ambulatory Visit: Payer: Self-pay

## 2021-07-29 ENCOUNTER — Ambulatory Visit (INDEPENDENT_AMBULATORY_CARE_PROVIDER_SITE_OTHER): Payer: BC Managed Care – PPO | Admitting: Obstetrics and Gynecology

## 2021-07-29 ENCOUNTER — Encounter: Payer: Self-pay | Admitting: Obstetrics and Gynecology

## 2021-07-29 VITALS — BP 122/72 | HR 66 | Ht 63.5 in | Wt 154.0 lb

## 2021-07-29 DIAGNOSIS — Z01419 Encounter for gynecological examination (general) (routine) without abnormal findings: Secondary | ICD-10-CM | POA: Diagnosis not present

## 2021-07-29 DIAGNOSIS — Z0000 Encounter for general adult medical examination without abnormal findings: Secondary | ICD-10-CM

## 2021-07-29 DIAGNOSIS — Z113 Encounter for screening for infections with a predominantly sexual mode of transmission: Secondary | ICD-10-CM

## 2021-07-29 DIAGNOSIS — N841 Polyp of cervix uteri: Secondary | ICD-10-CM

## 2021-07-29 NOTE — Patient Instructions (Signed)

## 2021-07-30 ENCOUNTER — Other Ambulatory Visit: Payer: Self-pay | Admitting: Obstetrics and Gynecology

## 2021-07-30 DIAGNOSIS — E875 Hyperkalemia: Secondary | ICD-10-CM

## 2021-07-30 LAB — COMPREHENSIVE METABOLIC PANEL WITH GFR
AG Ratio: 2 (calc) (ref 1.0–2.5)
ALT: 17 U/L (ref 6–29)
AST: 21 U/L (ref 10–30)
Albumin: 4.9 g/dL (ref 3.6–5.1)
Alkaline phosphatase (APISO): 56 U/L (ref 31–125)
BUN: 13 mg/dL (ref 7–25)
CO2: 26 mmol/L (ref 20–32)
Calcium: 10.2 mg/dL (ref 8.6–10.2)
Chloride: 106 mmol/L (ref 98–110)
Creat: 0.93 mg/dL (ref 0.50–0.99)
Globulin: 2.5 g/dL (ref 1.9–3.7)
Glucose, Bld: 98 mg/dL (ref 65–99)
Potassium: 5.4 mmol/L — ABNORMAL HIGH (ref 3.5–5.3)
Sodium: 142 mmol/L (ref 135–146)
Total Bilirubin: 0.9 mg/dL (ref 0.2–1.2)
Total Protein: 7.4 g/dL (ref 6.1–8.1)

## 2021-07-30 LAB — LIPID PANEL
Cholesterol: 251 mg/dL — ABNORMAL HIGH
HDL: 78 mg/dL
LDL Cholesterol (Calc): 149 mg/dL — ABNORMAL HIGH
Non-HDL Cholesterol (Calc): 173 mg/dL — ABNORMAL HIGH
Total CHOL/HDL Ratio: 3.2 (calc)
Triglycerides: 122 mg/dL

## 2021-07-30 LAB — SURESWAB CT/NG/T. VAGINALIS
C. trachomatis RNA, TMA: NOT DETECTED
N. gonorrhoeae RNA, TMA: NOT DETECTED
Trichomonas vaginalis RNA: NOT DETECTED

## 2021-07-30 LAB — CBC
HCT: 44 % (ref 35.0–45.0)
Hemoglobin: 14.7 g/dL (ref 11.7–15.5)
MCH: 31.3 pg (ref 27.0–33.0)
MCHC: 33.4 g/dL (ref 32.0–36.0)
MCV: 93.8 fL (ref 80.0–100.0)
MPV: 10.2 fL (ref 7.5–12.5)
Platelets: 317 10*3/uL (ref 140–400)
RBC: 4.69 10*6/uL (ref 3.80–5.10)
RDW: 13.1 % (ref 11.0–15.0)
WBC: 7.6 10*3/uL (ref 3.8–10.8)

## 2021-08-08 ENCOUNTER — Other Ambulatory Visit: Payer: BC Managed Care – PPO

## 2021-08-08 ENCOUNTER — Other Ambulatory Visit: Payer: Self-pay

## 2021-08-08 DIAGNOSIS — E875 Hyperkalemia: Secondary | ICD-10-CM

## 2021-08-09 LAB — POTASSIUM: Potassium: 4.5 mmol/L (ref 3.5–5.3)

## 2021-12-10 ENCOUNTER — Encounter: Payer: Self-pay | Admitting: Family Medicine

## 2021-12-10 ENCOUNTER — Telehealth: Payer: BC Managed Care – PPO | Admitting: Family Medicine

## 2021-12-10 DIAGNOSIS — R6889 Other general symptoms and signs: Secondary | ICD-10-CM | POA: Diagnosis not present

## 2021-12-10 MED ORDER — PREDNISONE 20 MG PO TABS: 40.0000 mg | ORAL_TABLET | Freq: Every day | ORAL | 0 refills | Status: AC

## 2021-12-10 MED ORDER — OSELTAMIVIR PHOSPHATE 75 MG PO CAPS: 75.0000 mg | ORAL_CAPSULE | Freq: Two times a day (BID) | ORAL | 0 refills | Status: AC

## 2021-12-10 MED ORDER — BENZONATATE 100 MG PO CAPS: 100.0000 mg | ORAL_CAPSULE | Freq: Three times a day (TID) | ORAL | 0 refills | Status: DC | PRN

## 2021-12-10 MED ORDER — ONDANSETRON 4 MG PO TBDP: 4.0000 mg | ORAL_TABLET | Freq: Three times a day (TID) | ORAL | 0 refills | Status: DC | PRN

## 2021-12-10 NOTE — Progress Notes (Signed)
CC: URI s/s's  Pamela Villegas here for URI complaints. Due to COVID-19 pandemic, we are interacting via web portal for an electronic face-to-face visit. I verified patient's ID using 2 identifiers. Patient agreed to proceed with visit via this method. Patient is at home, I am at office. Patient and I are present for visit.   Duration: 1 day  Associated symptoms: Fever (100 F), sinus headache, rhinorrhea, itchy watery eyes, myalgia, and coughing, nausea Denies: sinus congestion, sinus pain, ear pain, ear drainage, sore throat, wheezing, shortness of breath, and V/D Treatment to date: Zycam Sick contacts: No  Past Medical History:  Diagnosis Date   Anxiety    Chicken pox    Depression    Family history of colon cancer    Family history of melanoma    Family history of ovarian cancer    Family history of prostate cancer    Hyperlipidemia    Urinary tract infection     Objective No conversational dyspnea Age appropriate judgment and insight Nml affect and mood  Flu-like symptoms - Plan: ondansetron (ZOFRAN-ODT) 4 MG disintegrating tablet, predniSONE (DELTASONE) 20 MG tablet, benzonatate (TESSALON) 100 MG capsule, oseltamivir (TAMIFLU) 75 MG capsule  Orders as above. Empirically tx for flu. She will take a home covid test. Continue to push fluids, practice good hand hygiene, cover mouth when coughing. F/u prn. If starting to experience fevers, shaking, or shortness of breath, seek immediate care. Pt voiced understanding and agreement to the plan.  Jilda Roche Jarales, DO 12/10/21 2:16 PM

## 2022-02-03 DIAGNOSIS — L570 Actinic keratosis: Secondary | ICD-10-CM | POA: Diagnosis not present

## 2022-02-03 DIAGNOSIS — L821 Other seborrheic keratosis: Secondary | ICD-10-CM | POA: Diagnosis not present

## 2022-02-03 DIAGNOSIS — D225 Melanocytic nevi of trunk: Secondary | ICD-10-CM | POA: Diagnosis not present

## 2022-02-03 DIAGNOSIS — L814 Other melanin hyperpigmentation: Secondary | ICD-10-CM | POA: Diagnosis not present

## 2022-04-13 NOTE — Progress Notes (Unsigned)
[facility] [address] [place], [place] [address] [phone]  Date:  04/16/2022   Name:  Pamela Villegas   DOB:  1976/06/30   MRN:  [id number]  PCP:  Pearline Cables, MD    Chief Complaint: No chief complaint on file.   History of Present Illness:  Pamela Villegas is a 46 y.o. very pleasant female patient who presents with the following:  Patient seen today with concern of persistent cough-generally in good health, she does have history of cancer in her family Most recent visit with myself was September of last year She has GYN follow-up, Dr. Oscar La  COVID-19 series Pap screening Mammogram Blood work done September of last year Patient Active Problem List   Diagnosis Date Noted   Right shoulder pain 05/17/2018   Right carpal tunnel syndrome 05/17/2018   Genetic testing 10/30/2017   Family history of melanoma    Family history of prostate cancer    Family history of ovarian cancer 05/26/2017   Family history of colon cancer 05/26/2017   Chronic female pelvic pain 04/10/2017   Cyst of right ovary 04/10/2017   Pain of right hip joint 04/10/2017    Past Medical History:  Diagnosis Date   Anxiety    Chicken pox    Depression    Family history of colon cancer    Family history of melanoma    Family history of ovarian cancer    Family history of prostate cancer    Hyperlipidemia    Urinary tract infection     Past Surgical History:  Procedure Laterality Date   ANKLE SURGERY Right 2000   BREAST BIOPSY  2002   WISDOM TOOTH EXTRACTION  1998    Social History   Tobacco Use   Smoking status: Never   Smokeless tobacco: Never  Vaping Use   Vaping Use: Never used  Substance Use Topics   Alcohol use: Not Currently   Drug use: No    Family History  Problem Relation Age of Onset   Ovarian cancer Mother 22       ovarian cancer   Lung cancer Father 67       Lung cancer   Osteoporosis Maternal  Grandmother    Colon cancer Maternal Grandfather        possibly dx under 50   Esophageal cancer Maternal Grandfather 39   Parkinsonism Paternal Grandmother    Melanoma Sister 50       back    Colon polyps Maternal Aunt    Cervical cancer Maternal Aunt    Colon cancer Maternal Uncle 65   Prostate cancer Maternal Uncle 62    No Known Allergies  Medication list has been reviewed and updated.  Current Outpatient Medications on File Prior to Visit  Medication Sig Dispense Refill   b complex vitamins capsule Take 1 capsule by mouth daily.     benzonatate (TESSALON) 100 MG capsule Take 1 capsule (100 mg total) by mouth 3 (three) times daily as needed. 30 capsule 0   Coenzyme Q10-Fish Oil-Vit E (CO-Q 10 OMEGA-3 FISH OIL PO) Take by mouth.     Multiple Vitamin (MULTI-VITAMIN DAILY PO) Take by mouth daily.     Omega-3 1000 MG CAPS Take 1 capsule by mouth daily.     ondansetron (ZOFRAN-ODT) 4 MG disintegrating tablet Take 1 tablet (4 mg total) by mouth every 8 (eight) hours as needed for nausea or vomiting. 20  tablet 0   valACYclovir (VALTREX) 500 MG tablet Take 1 tablet (500 mg total) by mouth daily. 90 tablet 3   VITAMIN C-VITAMIN D-ZINC PO Take by mouth.     No current facility-administered medications on file prior to visit.    Review of Systems:  As per HPI- otherwise negative.   Physical Examination: There were no vitals filed for this visit. There were no vitals filed for this visit. There is no height or weight on file to calculate BMI. Ideal Body Weight:    GEN: no acute distress. HEENT: Atraumatic, Normocephalic.  Ears and Nose: No external deformity. CV: RRR, No M/G/R. No JVD. No thrill. No extra heart sounds. PULM: CTA B, no wheezes, crackles, rhonchi. No retractions. No resp. distress. No accessory muscle use. ABD: S, NT, ND, +BS. No rebound. No HSM. EXTR: No c/c/e PSYCH: Normally interactive. Conversant.    Assessment and Plan: ***  Signed Abbe Amsterdam,  MD

## 2022-04-13 NOTE — Patient Instructions (Incomplete)
It was good to see you again today, please let me know if your cough does not improve soon Let's try prednisone for 3-6 days I would also suggest using claritin/ zyrtec and a nasal steroid spray such as flonase or nasacort I will be in touch with your labs also

## 2022-04-16 ENCOUNTER — Ambulatory Visit: Payer: BC Managed Care – PPO | Admitting: Family Medicine

## 2022-04-16 ENCOUNTER — Encounter: Payer: Self-pay | Admitting: Family Medicine

## 2022-04-16 VITALS — BP 110/60 | HR 60 | Temp 98.1°F | Resp 18 | Ht 64.0 in | Wt 158.0 lb

## 2022-04-16 DIAGNOSIS — N926 Irregular menstruation, unspecified: Secondary | ICD-10-CM

## 2022-04-16 DIAGNOSIS — Z131 Encounter for screening for diabetes mellitus: Secondary | ICD-10-CM | POA: Diagnosis not present

## 2022-04-16 DIAGNOSIS — Z130 Encounter for screening for diseases of the blood and blood-forming organs and certain disorders involving the immune mechanism: Secondary | ICD-10-CM

## 2022-04-16 DIAGNOSIS — Z13220 Encounter for screening for lipoid disorders: Secondary | ICD-10-CM | POA: Diagnosis not present

## 2022-04-16 DIAGNOSIS — R052 Subacute cough: Secondary | ICD-10-CM | POA: Diagnosis not present

## 2022-04-16 DIAGNOSIS — Z1329 Encounter for screening for other suspected endocrine disorder: Secondary | ICD-10-CM | POA: Diagnosis not present

## 2022-04-16 DIAGNOSIS — Z1231 Encounter for screening mammogram for malignant neoplasm of breast: Secondary | ICD-10-CM

## 2022-04-16 LAB — LIPID PANEL
Cholesterol: 226 mg/dL — ABNORMAL HIGH (ref 0–200)
HDL: 66.6 mg/dL (ref >39.00)
LDL Cholesterol: 148 mg/dL — ABNORMAL HIGH (ref 0–99)
NonHDL: 159.57
Total CHOL/HDL Ratio: 3
Triglycerides: 58 mg/dL (ref 0.0–149.0)
VLDL: 11.6 mg/dL (ref 0.0–40.0)

## 2022-04-16 LAB — CBC
HCT: 40.4 % (ref 36.0–46.0)
Hemoglobin: 13.7 g/dL (ref 12.0–15.0)
MCHC: 33.8 g/dL (ref 30.0–36.0)
MCV: 93 fl (ref 78.0–100.0)
Platelets: 294 10*3/uL (ref 150.0–400.0)
RBC: 4.34 Mil/uL (ref 3.87–5.11)
RDW: 13.6 % (ref 11.5–15.5)
WBC: 5.9 10*3/uL (ref 4.0–10.5)

## 2022-04-16 LAB — COMPREHENSIVE METABOLIC PANEL
ALT: 25 U/L (ref 0–35)
AST: 27 U/L (ref 0–37)
Albumin: 4.6 g/dL (ref 3.5–5.2)
Alkaline Phosphatase: 59 U/L (ref 39–117)
BUN: 11 mg/dL (ref 6–23)
CO2: 29 mEq/L (ref 19–32)
Calcium: 9.8 mg/dL (ref 8.4–10.5)
Chloride: 102 mEq/L (ref 96–112)
Creatinine, Ser: 0.88 mg/dL (ref 0.40–1.20)
GFR: 80.28 mL/min (ref >60.00)
Glucose, Bld: 79 mg/dL (ref 70–99)
Potassium: 4.8 mEq/L (ref 3.5–5.1)
Sodium: 138 mEq/L (ref 135–145)
Total Bilirubin: 1 mg/dL (ref 0.2–1.2)
Total Protein: 7 g/dL (ref 6.0–8.3)

## 2022-04-16 LAB — FOLLICLE STIMULATING HORMONE: FSH: 6.2 m[IU]/mL

## 2022-04-16 LAB — HEMOGLOBIN A1C: Hgb A1c MFr Bld: 5.5 % (ref 4.6–6.5)

## 2022-04-16 LAB — TSH: TSH: 1.04 u[IU]/mL (ref 0.35–5.50)

## 2022-04-16 MED ORDER — PREDNISONE 20 MG PO TABS: ORAL_TABLET | ORAL | 0 refills | Status: DC

## 2022-04-28 ENCOUNTER — Ambulatory Visit: Payer: BC Managed Care – PPO | Admitting: Family

## 2022-04-29 ENCOUNTER — Ambulatory Visit (HOSPITAL_BASED_OUTPATIENT_CLINIC_OR_DEPARTMENT_OTHER)
Admission: RE | Admit: 2022-04-29 | Discharge: 2022-04-29 | Disposition: A | Discharge status: Home / Self Care | Payer: BC Managed Care – PPO | Source: Ambulatory Visit | Attending: Family Medicine | Admitting: Family Medicine

## 2022-04-29 ENCOUNTER — Ambulatory Visit (INDEPENDENT_AMBULATORY_CARE_PROVIDER_SITE_OTHER): Payer: BC Managed Care – PPO | Admitting: Family

## 2022-04-29 VITALS — BP 110/69 | HR 57 | Temp 98.6°F | Resp 16 | Wt 162.0 lb

## 2022-04-29 DIAGNOSIS — J019 Acute sinusitis, unspecified: Secondary | ICD-10-CM | POA: Diagnosis not present

## 2022-04-29 DIAGNOSIS — Z1231 Encounter for screening mammogram for malignant neoplasm of breast: Secondary | ICD-10-CM | POA: Diagnosis not present

## 2022-04-29 MED ORDER — FLUCONAZOLE 150 MG PO TABS: ORAL_TABLET | ORAL | 0 refills | Status: AC

## 2022-04-29 MED ORDER — AMOXICILLIN-POT CLAVULANATE 875-125 MG PO TABS: 1.0000 | ORAL_TABLET | Freq: Two times a day (BID) | ORAL | 0 refills | Status: AC

## 2022-04-29 MED ORDER — MONTELUKAST SODIUM 10 MG PO TABS: 10.0000 mg | ORAL_TABLET | Freq: Every day | ORAL | 3 refills | Status: DC

## 2022-04-29 NOTE — Assessment & Plan Note (Signed)
She had brief improvement following prednisone rx. She is maintained on claritin/flonase.  I suspect that there is an allergy component as well as possible bacterial sinusitis.  Pt is advised as follows:  Please start augmentin (antibiotic) twice daily.  Continue flonase/claritin daily. Add singulair once daily in the evening. Call if symptoms worsen of if not improved in 1 week.

## 2022-05-01 ENCOUNTER — Other Ambulatory Visit: Payer: Self-pay | Admitting: Family Medicine

## 2022-05-01 DIAGNOSIS — R928 Other abnormal and inconclusive findings on diagnostic imaging of breast: Secondary | ICD-10-CM

## 2022-05-16 ENCOUNTER — Ambulatory Visit
Admission: RE | Admit: 2022-05-16 | Discharge: 2022-05-16 | Disposition: A | Discharge status: Home / Self Care | Payer: BC Managed Care – PPO | Source: Ambulatory Visit | Attending: Family Medicine | Admitting: Family Medicine

## 2022-05-16 ENCOUNTER — Other Ambulatory Visit: Payer: Self-pay | Admitting: Family Medicine

## 2022-05-16 DIAGNOSIS — N6489 Other specified disorders of breast: Secondary | ICD-10-CM

## 2022-05-16 DIAGNOSIS — R928 Other abnormal and inconclusive findings on diagnostic imaging of breast: Secondary | ICD-10-CM

## 2022-05-16 DIAGNOSIS — R922 Inconclusive mammogram: Secondary | ICD-10-CM | POA: Diagnosis not present

## 2022-05-16 DIAGNOSIS — N6002 Solitary cyst of left breast: Secondary | ICD-10-CM | POA: Diagnosis not present

## 2022-06-06 ENCOUNTER — Ambulatory Visit
Admission: RE | Admit: 2022-06-06 | Discharge: 2022-06-06 | Disposition: A | Discharge status: Home / Self Care | Payer: BC Managed Care – PPO | Source: Ambulatory Visit | Attending: Family Medicine | Admitting: Family Medicine

## 2022-06-06 DIAGNOSIS — N6489 Other specified disorders of breast: Secondary | ICD-10-CM

## 2022-06-17 ENCOUNTER — Other Ambulatory Visit: Payer: Self-pay

## 2022-06-17 MED ORDER — MONTELUKAST SODIUM 10 MG PO TABS: 10.0000 mg | ORAL_TABLET | Freq: Every day | ORAL | 1 refills | Status: AC

## 2022-07-22 NOTE — Progress Notes (Deleted)
46 y.o. G0P0 Divorced White or Caucasian Not Hispanic or Latino female here for annual exam.      No LMP recorded.          Sexually active: {yes no:314532}  The current method of family planning is {contraception:315051}.    Exercising: {yes no:314532}  {types:19826} Smoker:  {YES [id number]  Health Maintenance: Pap:  07/19/18 Neg:Neg HR HPV History of abnormal Pap:  no MMG:  04/30/22 incomplete 05/16/22 diag Bi-rads 1 normal  BMD:   n/a Colonoscopy: 02/20/21 normal f/u 10 years  TDaP:  07/28/18 Gardasil: n/a   reports that she has never smoked. She has never used smokeless tobacco. She reports that she does not currently use alcohol. She reports that she does not use drugs.  Past Medical History:  Diagnosis Date   Anxiety    Chicken pox    Depression    Family history of colon cancer    Family history of melanoma    Family history of ovarian cancer    Family history of prostate cancer    Hyperlipidemia    Urinary tract infection     Past Surgical History:  Procedure Laterality Date   ANKLE SURGERY Right 2000   BREAST BIOPSY  2002   WISDOM TOOTH EXTRACTION  1998    Current Outpatient Medications  Medication Sig Dispense Refill   amoxicillin-clavulanate (AUGMENTIN) 875-125 MG tablet Take 1 tablet by mouth 2 (two) times daily. 20 tablet 0   b complex vitamins capsule Take 1 capsule by mouth daily.     Coenzyme Q10-Fish Oil-Vit E (CO-Q 10 OMEGA-3 FISH OIL PO) Take by mouth.     fluconazole (DIFLUCAN) 150 MG tablet Take 1 tablet by mouth once as needed for vaginal yeast infection 1 tablet 0   montelukast (SINGULAIR) 10 MG tablet Take 1 tablet (10 mg total) by mouth at bedtime. 90 tablet 1   Multiple Vitamin (MULTI-VITAMIN DAILY PO) Take by mouth daily.     Omega-3 1000 MG CAPS Take 1 capsule by mouth daily.     VITAMIN C-VITAMIN D-ZINC PO Take by mouth.     No current facility-administered medications for this visit.    Family History  Problem Relation Age of Onset    Ovarian cancer Mother 44       ovarian cancer   Lung cancer Father 47       Lung cancer   Osteoporosis Maternal Grandmother    Colon cancer Maternal Grandfather        possibly dx under 50   Esophageal cancer Maternal Grandfather 6   Parkinsonism Paternal Grandmother    Melanoma Sister 65       back    Colon polyps Maternal Aunt    Cervical cancer Maternal Aunt    Colon cancer Maternal Uncle 21   Prostate cancer Maternal Uncle 70    Review of Systems  Exam:   There were no vitals taken for this visit.  Weight change: @WEIGHTCHANGE @ Height:      Ht Readings from Last 3 Encounters:  04/16/22 5\' 4"  (1.626 m)  07/29/21 5' 3.5" (1.613 m)  04/04/21 5\' 4"  (1.626 m)    General appearance: alert, cooperative and appears stated age Head: Normocephalic, without obvious abnormality, atraumatic Neck: no adenopathy, supple, symmetrical, trachea midline and thyroid {CHL AMB PHY EX THYROID NORM DEFAULT:[phone]::"normal to inspection and palpation"} Lungs: clear to auscultation bilaterally Cardiovascular: regular rate and rhythm Breasts: {Exam; breast:13139::"normal appearance, no masses or tenderness"} Abdomen: soft, non-tender; non distended,  no masses,  no organomegaly Extremities: extremities normal, atraumatic, no cyanosis or edema Skin: Skin color, texture, turgor normal. No rashes or lesions Lymph nodes: Cervical, supraclavicular, and axillary nodes normal. No abnormal inguinal nodes palpated Neurologic: Grossly normal   Pelvic: External genitalia:  no lesions              Urethra:  normal appearing urethra with no masses, tenderness or lesions              Bartholins and Skenes: normal                 Vagina: normal appearing vagina with normal color and discharge, no lesions              Cervix: {CHL AMB PHY EX CERVIX NORM DEFAULT:[id number]::"no lesions"}               Bimanual Exam:  Uterus:  {CHL AMB PHY EX UTERUS NORM [phone]::"normal size, contour,  position, consistency, mobility, non-tender"}              Adnexa: {CHL AMB PHY EX ADNEXA NO MASS DEFAULT:[phone]::"no mass, fullness, tenderness"}               Rectovaginal: Confirms               Anus:  normal sphincter tone, no lesions  *** chaperoned for the exam.  A:  Well Woman with normal exam  P:

## 2022-07-29 ENCOUNTER — Ambulatory Visit: Payer: BC Managed Care – PPO | Admitting: Obstetrics and Gynecology

## 2022-07-30 ENCOUNTER — Ambulatory Visit: Payer: BC Managed Care – PPO | Admitting: Obstetrics and Gynecology
